# Patient Record
Sex: Male | Born: 1995 | Race: White | Hispanic: No | Marital: Single | Smoking: Former smoker
Health system: Southern US, Community
[De-identification: ages and names within clinical notes are randomized; demographics above are authoritative.]

## PROBLEM LIST (undated history)

## (undated) DIAGNOSIS — N200 Calculus of kidney: Secondary | ICD-10-CM

## (undated) HISTORY — PX: WRIST SURGERY: SHX841

---

## 2017-04-20 ENCOUNTER — Emergency Department: Payer: BLUE CROSS/BLUE SHIELD

## 2017-04-20 ENCOUNTER — Encounter: Payer: Self-pay | Admitting: Emergency Medicine

## 2017-04-20 ENCOUNTER — Emergency Department
Admission: EM | Admit: 2017-04-20 | Discharge: 2017-04-20 | Disposition: A | Discharge status: Home / Self Care | Payer: BLUE CROSS/BLUE SHIELD | Attending: Emergency Medicine | Admitting: Emergency Medicine

## 2017-04-20 DIAGNOSIS — N200 Calculus of kidney: Secondary | ICD-10-CM | POA: Diagnosis not present

## 2017-04-20 DIAGNOSIS — R109 Unspecified abdominal pain: Secondary | ICD-10-CM | POA: Diagnosis present

## 2017-04-20 DIAGNOSIS — Z87891 Personal history of nicotine dependence: Secondary | ICD-10-CM | POA: Insufficient documentation

## 2017-04-20 LAB — URINALYSIS, COMPLETE (UACMP) WITH MICROSCOPIC
BACTERIA UA: NONE SEEN
Bilirubin Urine: NEGATIVE
Glucose, UA: NEGATIVE mg/dL
Ketones, ur: NEGATIVE mg/dL
Leukocytes, UA: NEGATIVE
Nitrite: NEGATIVE
PROTEIN: NEGATIVE mg/dL
SPECIFIC GRAVITY, URINE: 1.02 (ref 1.005–1.030)
pH: 6 (ref 5.0–8.0)

## 2017-04-20 LAB — CBC
HEMATOCRIT: 46.6 % (ref 40.0–52.0)
Hemoglobin: 16.2 g/dL (ref 13.0–18.0)
MCH: 30.7 pg (ref 26.0–34.0)
MCHC: 34.8 g/dL (ref 32.0–36.0)
MCV: 88.1 fL (ref 80.0–100.0)
Platelets: 211 10*3/uL (ref 150–440)
RBC: 5.29 MIL/uL (ref 4.40–5.90)
RDW: 13.3 % (ref 11.5–14.5)
WBC: 10.8 10*3/uL — AB (ref 3.8–10.6)

## 2017-04-20 LAB — BASIC METABOLIC PANEL
ANION GAP: 9 (ref 5–15)
BUN: 16 mg/dL (ref 6–20)
CALCIUM: 9.8 mg/dL (ref 8.9–10.3)
CO2: 25 mmol/L (ref 22–32)
Chloride: 106 mmol/L (ref 101–111)
Creatinine, Ser: 1.11 mg/dL (ref 0.61–1.24)
GLUCOSE: 102 mg/dL — AB (ref 65–99)
POTASSIUM: 4.1 mmol/L (ref 3.5–5.1)
Sodium: 140 mmol/L (ref 135–145)

## 2017-04-20 MED ORDER — HYDROCODONE-ACETAMINOPHEN 5-325 MG PO TABS: 1.0000 | ORAL_TABLET | Freq: Four times a day (QID) | ORAL | 0 refills | Status: DC | PRN

## 2017-04-20 MED ORDER — PHENAZOPYRIDINE HCL 200 MG PO TABS: 200.0000 mg | ORAL_TABLET | Freq: Three times a day (TID) | ORAL | 0 refills | Status: AC | PRN

## 2017-04-20 MED ORDER — KETOROLAC TROMETHAMINE 30 MG/ML IJ SOLN
15.0000 mg | Freq: Once | INTRAMUSCULAR | Status: AC
  Administered 2017-04-20: 15 mg via INTRAVENOUS
  Filled 2017-04-20: qty 1

## 2017-04-20 MED ORDER — PHENAZOPYRIDINE HCL 200 MG PO TABS
200.0000 mg | ORAL_TABLET | Freq: Once | ORAL | Status: AC
  Administered 2017-04-20: 200 mg via ORAL
  Filled 2017-04-20: qty 1

## 2017-04-20 MED ORDER — SODIUM CHLORIDE 0.9 % IV BOLUS (SEPSIS)
1000.0000 mL | Freq: Once | INTRAVENOUS | Status: AC
  Administered 2017-04-20: 1000 mL via INTRAVENOUS

## 2017-04-20 MED ORDER — KETOROLAC TROMETHAMINE 30 MG/ML IJ SOLN
15.0000 mg | Freq: Once | INTRAMUSCULAR | Status: AC
  Administered 2017-04-20: 15 mg via INTRAVENOUS

## 2017-04-20 MED ORDER — KETOROLAC TROMETHAMINE 30 MG/ML IJ SOLN
INTRAMUSCULAR | Status: AC
  Filled 2017-04-20: qty 1

## 2017-04-20 MED ORDER — MORPHINE SULFATE (PF) 4 MG/ML IV SOLN
4.0000 mg | Freq: Once | INTRAVENOUS | Status: AC
  Administered 2017-04-20: 4 mg via INTRAVENOUS
  Filled 2017-04-20: qty 1

## 2017-04-20 MED ORDER — IOPAMIDOL (ISOVUE-300) INJECTION 61%
100.0000 mL | Freq: Once | INTRAVENOUS | Status: AC | PRN
  Administered 2017-04-20: 100 mL via INTRAVENOUS

## 2017-04-20 NOTE — ED Notes (Signed)
Report to Amy, RN

## 2017-04-20 NOTE — ED Notes (Signed)
Resumed care from ally rn.  Pt alert.  Iv fluids infusing.  Pt watching tv.  No family with pt.  Pt has nausea.  Pt reports low back pain.

## 2017-04-20 NOTE — ED Provider Notes (Signed)
[facility] Emergency Department Provider Note  ____________________________________________   First MD Initiated Contact with Patient 04/20/17 1321     (approximate)  I have reviewed the triage vital signs and the nursing notes.   HISTORY  Chief Complaint Flank Pain    HPI Timothy Schneider is a 21 y.o. male who self presents to the emergency department with 2 weeks of bilateral flank pain. He also reports hematuria several days ago as well as 2 days ago he had a temperature to 101 at home. Today he presented to an urgent care who advised him to come to the emergency department to evaluate for kidney stone versus kidney infection. He denies fevers or chills.His pain is severe bilateral flank nonradiating nothing seems to make it better or worse.   History reviewed. No pertinent past medical history.  There are no active problems to display for this patient.   Past Surgical History:  Procedure Laterality Date  . WRIST SURGERY      Prior to Admission medications   Medication Sig Start Date End Date Taking? Authorizing Provider  HYDROcodone-acetaminophen (NORCO) 5-325 MG tablet Take 1 tablet by mouth every 6 (six) hours as needed for severe pain. 04/20/17   Merrily Brittleifenbark, Victorio Creeden, MD  Ibuprofen 200 MG CAPS Take by mouth.    [provider]  oxyCODONE-acetaminophen (PERCOCET/ROXICET) 5-325 MG tablet Take 1 tablet by mouth every 4 (four) hours as needed.    [provider]  phenazopyridine (PYRIDIUM) 200 MG tablet Take 1 tablet (200 mg total) by mouth 3 (three) times daily as needed for pain. 04/20/17 04/23/17  Merrily Brittleifenbark, Chauncy Mangiaracina, MD    Allergies Morphine and related  History reviewed. No pertinent family history.  Social History Social History  Substance Use Topics  . Smoking status: Former Games developermoker  . Smokeless tobacco: Never Used  . Alcohol use Yes    Review of Systems Constitutional: No fever/chills Eyes: No visual changes. ENT: No  sore throat. Cardiovascular: Denies chest pain. Respiratory: Denies shortness of breath. Gastrointestinal: positive abdominal pain.  Positive nausea, no vomiting.  No diarrhea.  No constipation. Genitourinary: Positive for hematuria. Musculoskeletal: Positive for back pain. Skin: Negative for rash. Neurological: Negative for headaches, focal weakness or numbness.   ____________________________________________   PHYSICAL EXAM:  VITAL SIGNS: ED Triage Vitals  Enc Vitals Group     BP 04/20/17 1229 (!) 124/93     Pulse Rate 04/20/17 1229 81     Resp 04/20/17 1229 20     Temp 04/20/17 1229 98.4 F (36.9 C)     Temp Source 04/20/17 1229 Oral     SpO2 04/20/17 1229 100 %     Weight 04/20/17 1228 245 lb (111.1 kg)     Height 04/20/17 1228 6\' 1"  (1.854 m)     Head Circumference --      Peak Flow --      Pain Score 04/20/17 1228 9     Pain Loc --      Pain Edu? --      Excl. in GC? --     Constitutional: Alert and oriented x 4 Appears uncomfortable pacing around the room Eyes: PERRL EOMI. Head: Atraumatic. Nose: No congestion/rhinnorhea. Mouth/Throat: No trismus Neck: No stridor.   Cardiovascular: Normal rate, regular rhythm. Grossly normal heart sounds.  Good peripheral circulation. Respiratory: Normal respiratory effort.  No retractions. Lungs CTAB and moving good air Gastrointestinal: Soft nondistended diffusely tender to palpation without focality no McBurney's tenderness negative Rovsing's no costovertebral tenderness  Musculoskeletal: No lower extremity edema   Neurologic:  Normal speech and language. No gross focal neurologic deficits are appreciated. Skin:  Skin is warm, dry and intact. No rash noted. Psychiatric: Mood and affect are normal. Speech and behavior are normal.    ____________________________________________   DIFFERENTIAL  Kidney stone, pyelonephritis, appendicitis, diverticulitis ____________________________________________   LABS (all labs  ordered are listed, but only abnormal results are displayed)  Labs Reviewed  URINALYSIS, COMPLETE (UACMP) WITH MICROSCOPIC - Abnormal; Notable for the following:       Result Value   Color, Urine YELLOW (*)    APPearance CLEAR (*)    Hgb urine dipstick MODERATE (*)    Squamous Epithelial / LPF 0-5 (*)    All other components within normal limits  CBC - Abnormal; Notable for the following:    WBC 10.8 (*)    All other components within normal limits  BASIC METABOLIC PANEL - Abnormal; Notable for the following:    Glucose, Bld 102 (*)    All other components within normal limits  URINE CULTURE    Hematuria without evidence of urinary tract infection __________________________________________  EKG   ____________________________________________  RADIOLOGY  CT scan shows 2 small stones within the right kidney but no other clear etiology of the patient's symptoms ____________________________________________   PROCEDURES  Procedure(s) performed: no  Procedures  Critical Care performed: no  Observation: no ____________________________________________   INITIAL IMPRESSION / ASSESSMENT AND PLAN / ED COURSE  Pertinent labs & imaging results that were available during my care of the patient were reviewed by me and considered in my medical decision making (see chart for details).  The patient's story is concerning for kidney stone although temperature and some abdominal discomfort is concerning for possible pyelonephritis as well. I will do his CT instead with IV contrast.  CT scan of abdomen and pelvis does show 2 small right-sided stones within the kidney but no clear explanation for the patient's symptoms. He either has passed a stone which explains the persistent hematuria or since of facetious disorder and he has placed blood himself in the urinalysis. I performed [place] multi drug [place] and the patient has no opioid prescriptions. He also was nearly  incapacitated by a beer 4 mg of morphine so I doubt facetious disorder. The patient likely has passed a kidney stone. I will treat him symptomatically and refer him back to primary care.       ____________________________________________   FINAL CLINICAL IMPRESSION(S) / ED DIAGNOSES  Final diagnoses:  Kidney stone      NEW MEDICATIONS STARTED DURING THIS VISIT:  New Prescriptions   HYDROCODONE-ACETAMINOPHEN (NORCO) 5-325 MG TABLET    Take 1 tablet by mouth every 6 (six) hours as needed for severe pain.   PHENAZOPYRIDINE (PYRIDIUM) 200 MG TABLET    Take 1 tablet (200 mg total) by mouth 3 (three) times daily as needed for pain.     Note:  This document was prepared using Dragon voice recognition software and may include unintentional dictation errors.     Merrily Brittle, MD 04/20/17 1536

## 2017-04-20 NOTE — ED Notes (Signed)
Back pain for 2 weeks, worsening today. Pt able to urinate. Pt alert and oriented X4, active, cooperative, pt in NAD. RR even and unlabored, color WNL.

## 2017-04-20 NOTE — ED Triage Notes (Addendum)
Pt c/o bilateral flank pain for 2 weeks. Sent from urgent care for r/o kidney stones.  Did see blood in urine 2 days ago. Nausea no vomiting.  Fever 101 2 days ago per pt.  Ambulatory to triage.

## 2017-04-20 NOTE — Discharge Instructions (Signed)
Fortunately today her CT scan shows that the stone that you had before has already passed. It is normal to feel discomfort for several days afterwards. Please take over-the-counter pain medication as needed and follow up with the primary care physician within the next week or so for recheck. Return to the emergency department for any new or worsening symptoms such as if your pain worsens, if you cannot eat or drink, or for any other concerns whatsoever.  It was a pleasure to take care of you today, and thank you for coming to our emergency department.  If you have any questions or concerns before leaving please ask the nurse to grab me and I'm more than happy to go through your aftercare instructions again.  If you were prescribed any opioid pain medication today such as Norco, Vicodin, Percocet, morphine, hydrocodone, or oxycodone please make sure you do not drive when you are taking this medication as it can alter your ability to drive safely.  If you have any concerns once you are home that you are not improving or are in fact getting worse before you can make it to your follow-up appointment, please do not hesitate to call 911 and come back for further evaluation.  Merrily Brittle MD  Results for orders placed or performed during the hospital encounter of 04/20/17  Urinalysis, Complete w Microscopic  Result Value Ref Range   Color, Urine YELLOW (A) YELLOW   APPearance CLEAR (A) CLEAR   Specific Gravity, Urine 1.020 1.005 - 1.030   pH 6.0 5.0 - 8.0   Glucose, UA NEGATIVE NEGATIVE mg/dL   Hgb urine dipstick MODERATE (A) NEGATIVE   Bilirubin Urine NEGATIVE NEGATIVE   Ketones, ur NEGATIVE NEGATIVE mg/dL   Protein, ur NEGATIVE NEGATIVE mg/dL   Nitrite NEGATIVE NEGATIVE   Leukocytes, UA NEGATIVE NEGATIVE   RBC / HPF TOO NUMEROUS TO COUNT 0 - 5 RBC/hpf   WBC, UA 0-5 0 - 5 WBC/hpf   Bacteria, UA NONE SEEN NONE SEEN   Squamous Epithelial / LPF 0-5 (A) NONE SEEN   Mucous PRESENT   CBC  Result  Value Ref Range   WBC 10.8 (H) 3.8 - 10.6 K/uL   RBC 5.29 4.40 - 5.90 MIL/uL   Hemoglobin 16.2 13.0 - 18.0 g/dL   HCT 16.1 09.6 - 04.5 %   MCV 88.1 80.0 - 100.0 fL   MCH 30.7 26.0 - 34.0 pg   MCHC 34.8 32.0 - 36.0 g/dL   RDW 40.9 81.1 - 91.4 %   Platelets 211 150 - 440 K/uL  Basic metabolic panel  Result Value Ref Range   Sodium 140 135 - 145 mmol/L   Potassium 4.1 3.5 - 5.1 mmol/L   Chloride 106 101 - 111 mmol/L   CO2 25 22 - 32 mmol/L   Glucose, Bld 102 (H) 65 - 99 mg/dL   BUN 16 6 - 20 mg/dL   Creatinine, Ser 7.82 0.61 - 1.24 mg/dL   Calcium 9.8 8.9 - 95.6 mg/dL   GFR calc non Af Amer >60 >60 mL/min   GFR calc Af Amer >60 >60 mL/min   Anion gap 9 5 - 15   Ct Abdomen Pelvis W Contrast  Result Date: 04/20/2017 CLINICAL DATA:  Bilateral flank pain for 2 weeks.  No known injury. EXAM: CT ABDOMEN AND PELVIS WITH CONTRAST TECHNIQUE: Multidetector CT imaging of the abdomen and pelvis was performed using the standard protocol following bolus administration of intravenous contrast. CONTRAST:  100 ml ISOVUE-300 IOPAMIDOL (  ISOVUE-300) INJECTION 61% COMPARISON:  None. FINDINGS: Lower chest: The lung bases are clear. No pleural or pericardial effusion. Hepatobiliary: Marked and diffuse low attenuation throughout is consistent with fatty infiltration. No focal lesion. Gallbladder and biliary tree appear normal. Pancreas: Unremarkable. No pancreatic ductal dilatation or surrounding inflammatory changes. Spleen: Normal in size without focal abnormality. Adrenals/Urinary Tract: 2 nonobstructing right renal stones are identified in the lower pole. The larger measures 0.3 cm. The kidneys otherwise appear normal. No ureteral stone or hydronephrosis. Urinary bladder is unremarkable. Adrenal glands appear normal. Stomach/Bowel: Stomach is within normal limits. Appendix appears normal. No evidence of bowel wall thickening, distention, or inflammatory changes. Vascular/Lymphatic: No significant vascular findings  are present. No enlarged abdominal or pelvic lymph nodes. Reproductive: Prostate is unremarkable. Other:  No ascites. Musculoskeletal: Negative. IMPRESSION: No acute abnormality abdomen or pelvis. No finding to explain the patient's symptoms. Diffuse fatty infiltration of the liver. Two nonobstructing right renal stones.  The larger measures 0.3 cm. Electronically Signed   By: Drusilla Kannerhomas  Dalessio M.D.   On: 04/20/2017 14:06

## 2017-04-21 LAB — URINE CULTURE

## 2017-06-05 ENCOUNTER — Encounter: Payer: Self-pay | Admitting: Emergency Medicine

## 2017-06-05 ENCOUNTER — Emergency Department: Payer: BLUE CROSS/BLUE SHIELD

## 2017-06-05 DIAGNOSIS — Z87891 Personal history of nicotine dependence: Secondary | ICD-10-CM | POA: Insufficient documentation

## 2017-06-05 DIAGNOSIS — R079 Chest pain, unspecified: Secondary | ICD-10-CM | POA: Diagnosis present

## 2017-06-05 DIAGNOSIS — F419 Anxiety disorder, unspecified: Secondary | ICD-10-CM | POA: Diagnosis not present

## 2017-06-05 LAB — BASIC METABOLIC PANEL
Anion gap: 7 (ref 5–15)
BUN: 21 mg/dL — ABNORMAL HIGH (ref 6–20)
CO2: 27 mmol/L (ref 22–32)
Calcium: 9.2 mg/dL (ref 8.9–10.3)
Chloride: 106 mmol/L (ref 101–111)
Creatinine, Ser: 1.29 mg/dL — ABNORMAL HIGH (ref 0.61–1.24)
GFR calc Af Amer: >60 mL/min (ref >60)
GFR calc non Af Amer: >60 mL/min (ref >60)
Glucose, Bld: 82 mg/dL (ref 65–99)
Potassium: 4 mmol/L (ref 3.5–5.1)
Sodium: 140 mmol/L (ref 135–145)

## 2017-06-05 LAB — CBC
HCT: 44.8 % (ref 40.0–52.0)
HEMOGLOBIN: 15.7 g/dL (ref 13.0–18.0)
MCH: 31.3 pg (ref 26.0–34.0)
MCHC: 35.1 g/dL (ref 32.0–36.0)
MCV: 89.2 fL (ref 80.0–100.0)
Platelets: 216 10*3/uL (ref 150–440)
RBC: 5.02 MIL/uL (ref 4.40–5.90)
RDW: 13.3 % (ref 11.5–14.5)
WBC: 12.3 10*3/uL — ABNORMAL HIGH (ref 3.8–10.6)

## 2017-06-05 LAB — TROPONIN I

## 2017-06-05 NOTE — ED Triage Notes (Signed)
Pt ambulatory to triage with steady gait, no distress noted. Pt c/o intermittent left sided chest pain that radiates into left arm, symptoms per pt have been going on for 2 months and today the pain and pressure has been constant with nausea. Pt sts "I think it may be anxiety because I have a long family history of it."

## 2017-06-06 ENCOUNTER — Emergency Department
Admission: EM | Admit: 2017-06-06 | Discharge: 2017-06-06 | Disposition: A | Discharge status: Home / Self Care | Payer: BLUE CROSS/BLUE SHIELD | Attending: Emergency Medicine | Admitting: Emergency Medicine

## 2017-06-06 DIAGNOSIS — F419 Anxiety disorder, unspecified: Secondary | ICD-10-CM

## 2017-06-06 LAB — FIBRIN DERIVATIVES D-DIMER (ARMC ONLY): Fibrin derivatives D-dimer (ARMC): 131.09 (ref 0.00–499.00)

## 2017-06-06 MED ORDER — LORAZEPAM 1 MG PO TABS: 1.0000 mg | ORAL_TABLET | Freq: Three times a day (TID) | ORAL | 0 refills | Status: AC | PRN

## 2017-06-06 MED ORDER — LORAZEPAM 2 MG/ML IJ SOLN
1.0000 mg | Freq: Once | INTRAMUSCULAR | Status: AC
  Administered 2017-06-06: 0.5 mg via INTRAVENOUS
  Filled 2017-06-06: qty 1

## 2017-06-06 NOTE — ED Notes (Signed)
Pt reports to ED w/ c/o CP. Pt sts that CP has been ongoing x 2 months. Pt sts that during episodes of CP x 2 months that he has SOB, nausea, dizziness and lightheadedness. Pt sts that this week tightness radiates to L arm, that L arm has felt numb. Pt also sts that he "passed out" this week r/t CP. Pt also c/o tremors that began 24 minutes prior to being roomed. VSS, pt able to speak w/o issue

## 2017-06-06 NOTE — ED Provider Notes (Signed)
[facility] Emergency Department Provider Note _   First MD Initiated Contact with Patient 06/06/17 0032     (approximate)  I have reviewed the triage vital signs and the nursing notes.   HISTORY  Chief Complaint Chest Pain    HPI Timothy Schneider is a 21 y.o. male presents to the emergency Department with multiple medical complaints including reproducible left sided chest pain with radiation to left arm, left arm numbness, positive dyspnea, positive headaches, positive nausea. Patient states that he wants to be checked for "cancer". Patient states that symptoms of been occurring for the past 2 months patient states that he does believe that he has anxiety and that he has a long family history of the same.   Past medical history None There are no active problems to display for this patient.   Past Surgical History:  Procedure Laterality Date  . WRIST SURGERY      Prior to Admission medications   Medication Sig Start Date End Date Taking? Authorizing Provider  HYDROcodone-acetaminophen (NORCO) 5-325 MG tablet Take 1 tablet by mouth every 6 (six) hours as needed for severe pain. 04/20/17   Merrily Brittle, MD  Ibuprofen 200 MG CAPS Take by mouth.    [provider]  oxyCODONE-acetaminophen (PERCOCET/ROXICET) 5-325 MG tablet Take 1 tablet by mouth every 4 (four) hours as needed.    [provider]    Allergies Morphine and related  History reviewed. No pertinent family history.  Social History Social History  Substance Use Topics  . Smoking status: Former Games developer  . Smokeless tobacco: Never Used  . Alcohol use Yes    Review of Systems Constitutional: Positive fever/chills Eyes: Positive visual changes. ENT: No sore throat. Cardiovascular: Positive chest pain. Respiratory: Positive shortness of breath. Gastrointestinal: Positive abdominal pain.  Positive nausea, no vomiting. Positive diarrhea.  No  constipation. Genitourinary: Positive for dysuria. Musculoskeletal: Positive for neck pain.  Positive for back pain. Integumentary: Negative for rash. Neurological: Positive for headaches, focal  numbness.   ____________________________________________   PHYSICAL EXAM:  VITAL SIGNS: ED Triage Vitals [06/05/17 2236]  Enc Vitals Group     BP 137/69     Pulse Rate 67     Resp 18     Temp 98.7 F (37.1 C)     Temp Source Oral     SpO2 99 %     Weight 107 kg (236 lb)     Height 1.854 m (6\' 1" )     Head Circumference      Peak Flow      Pain Score      Pain Loc      Pain Edu?      Excl. in GC?     Constitutional: Alert and oriented. Well appearing and in no acute distress.Anxious affect Eyes: Conjunctivae are normal. Head: Atraumatic. Mouth/Throat: Mucous membranes are moist.  Oropharynx non-erythematous. Neck: No stridor.  No meningeal signs. Cardiovascular: Normal rate, regular rhythm. Good peripheral circulation. Grossly normal heart sounds. Respiratory: Normal respiratory effort.  No retractions. Lungs CTAB. Gastrointestinal: Soft and nontender. No distention.  Musculoskeletal: No lower extremity tenderness nor edema. No gross deformities of extremities. Neurologic:  Normal speech and language. No gross focal neurologic deficits are appreciated.  Skin:  Skin is warm, dry and intact. No rash noted. Psychiatric: Mood and affect are normal. Speech and behavior are normal.  ____________________________________________   LABS (all labs ordered are listed, but only abnormal results are displayed)  Labs Reviewed  BASIC METABOLIC PANEL - Abnormal; Notable for the following:       Result Value   BUN 21 (*)    Creatinine, Ser 1.29 (*)    All other components within normal limits  CBC - Abnormal; Notable for the following:    WBC 12.3 (*)    All other components within normal limits  TROPONIN I  FIBRIN DERIVATIVES D-DIMER ([facility] ONLY)    ____________________________________________  EKG  ED ECG REPORT I, Drain N Yariana Hoaglund, the attending physician, personally viewed and interpreted this ECG.   Date: 06/06/2017  EKG Time: 10:32 PM  Rate: 67  Rhythm: Normal sinus rhythm  Axis: Normal  Intervals: Normal  ST&T Change: None  ____________________________________________  RADIOLOGY I, [place] Eiko Mcgowen, personally viewed and evaluated these images (plain radiographs) as part of my medical decision making, as well as reviewing the written report by the radiologist.  Dg Chest 2 View  Result Date: 06/05/2017 CLINICAL DATA:  Intermittent left-sided chest pain x2 months. EXAM: CHEST  2 VIEW COMPARISON:  04/20/2017 CT abdomen for the lung bases FINDINGS: The heart size and mediastinal contours are within normal limits. Both lungs are clear. The visualized skeletal structures are unremarkable. IMPRESSION: No active cardiopulmonary disease. Electronically Signed   By: Tollie Ethavid  Kwon M.D.   On: 06/05/2017 23:25      Procedures   ____________________________________________   INITIAL IMPRESSION / ASSESSMENT AND PLAN / ED COURSE  Pertinent labs & imaging results that were available during my care of the patient were reviewed by me and considered in my medical decision making (see chart for details).   Patient history and physical exam consistent with anxiety. Patient given Ativan 0.5mg  and will be referred to Dr Maryruth BunKapur     ____________________________________________  FINAL CLINICAL IMPRESSION(S) / ED DIAGNOSES  Final diagnoses:  Anxiety     MEDICATIONS GIVEN DURING THIS VISIT:  Medications  LORazepam (ATIVAN) injection 1 mg (0.5 mg Intravenous Given 06/06/17 0142)     NEW OUTPATIENT MEDICATIONS STARTED DURING THIS VISIT:  New Prescriptions   No medications on file    Modified Medications   No medications on file    Discontinued Medications   No medications on file     Note:  This document was  prepared using Dragon voice recognition software and may include unintentional dictation errors.    Darci CurrentBrown,  N, MD 06/06/17 0230

## 2017-11-16 ENCOUNTER — Encounter: Payer: Self-pay | Admitting: Emergency Medicine

## 2017-11-16 ENCOUNTER — Other Ambulatory Visit: Payer: Self-pay

## 2017-11-16 ENCOUNTER — Emergency Department: Payer: BLUE CROSS/BLUE SHIELD

## 2017-11-16 ENCOUNTER — Emergency Department
Admission: EM | Admit: 2017-11-16 | Discharge: 2017-11-16 | Disposition: A | Discharge status: Home / Self Care | Payer: BLUE CROSS/BLUE SHIELD | Attending: Emergency Medicine | Admitting: Emergency Medicine

## 2017-11-16 DIAGNOSIS — R109 Unspecified abdominal pain: Secondary | ICD-10-CM | POA: Diagnosis present

## 2017-11-16 DIAGNOSIS — Z87891 Personal history of nicotine dependence: Secondary | ICD-10-CM | POA: Diagnosis not present

## 2017-11-16 DIAGNOSIS — N200 Calculus of kidney: Secondary | ICD-10-CM | POA: Diagnosis not present

## 2017-11-16 DIAGNOSIS — Z79899 Other long term (current) drug therapy: Secondary | ICD-10-CM | POA: Diagnosis not present

## 2017-11-16 HISTORY — DX: Calculus of kidney: N20.0

## 2017-11-16 LAB — CBC WITH DIFFERENTIAL/PLATELET
BASOS PCT: 1 %
Basophils Absolute: 0.1 10*3/uL (ref 0–0.1)
EOS ABS: 0.1 10*3/uL (ref 0–0.7)
EOS PCT: 1 %
HCT: 47.8 % (ref 40.0–52.0)
HEMOGLOBIN: 16.4 g/dL (ref 13.0–18.0)
LYMPHS ABS: 2.5 10*3/uL (ref 1.0–3.6)
Lymphocytes Relative: 26 %
MCH: 30.2 pg (ref 26.0–34.0)
MCHC: 34.3 g/dL (ref 32.0–36.0)
MCV: 88.1 fL (ref 80.0–100.0)
Monocytes Absolute: 0.6 10*3/uL (ref 0.2–1.0)
Monocytes Relative: 6 %
NEUTROS PCT: 66 %
Neutro Abs: 6.6 10*3/uL — ABNORMAL HIGH (ref 1.4–6.5)
PLATELETS: 217 10*3/uL (ref 150–440)
RBC: 5.43 MIL/uL (ref 4.40–5.90)
RDW: 13.4 % (ref 11.5–14.5)
WBC: 9.9 10*3/uL (ref 3.8–10.6)

## 2017-11-16 LAB — COMPREHENSIVE METABOLIC PANEL
ALBUMIN: 4.3 g/dL (ref 3.5–5.0)
ALT: 47 U/L (ref 17–63)
ANION GAP: 8 (ref 5–15)
AST: 35 U/L (ref 15–41)
Alkaline Phosphatase: 72 U/L (ref 38–126)
BUN: 14 mg/dL (ref 6–20)
CHLORIDE: 103 mmol/L (ref 101–111)
CO2: 26 mmol/L (ref 22–32)
Calcium: 9.3 mg/dL (ref 8.9–10.3)
Creatinine, Ser: 0.88 mg/dL (ref 0.61–1.24)
GFR calc non Af Amer: >60 mL/min (ref >60)
GLUCOSE: 99 mg/dL (ref 65–99)
Potassium: 4 mmol/L (ref 3.5–5.1)
SODIUM: 137 mmol/L (ref 135–145)
Total Bilirubin: 0.9 mg/dL (ref 0.3–1.2)
Total Protein: 7.6 g/dL (ref 6.5–8.1)

## 2017-11-16 LAB — LIPASE, BLOOD: Lipase: 22 U/L (ref 11–51)

## 2017-11-16 MED ORDER — SODIUM CHLORIDE 0.9 % IV BOLUS (SEPSIS)
1000.0000 mL | Freq: Once | INTRAVENOUS | Status: AC
  Administered 2017-11-16: 1000 mL via INTRAVENOUS

## 2017-11-16 MED ORDER — OXYCODONE-ACETAMINOPHEN 5-325 MG PO TABS: 1.0000 | ORAL_TABLET | ORAL | 0 refills | Status: DC | PRN

## 2017-11-16 MED ORDER — OXYCODONE-ACETAMINOPHEN 5-325 MG PO TABS
1.0000 | ORAL_TABLET | Freq: Once | ORAL | Status: AC
  Administered 2017-11-16: 1 via ORAL
  Filled 2017-11-16: qty 1

## 2017-11-16 MED ORDER — ONDANSETRON 4 MG PO TBDP: 4.0000 mg | ORAL_TABLET | Freq: Three times a day (TID) | ORAL | 0 refills | Status: DC | PRN

## 2017-11-16 MED ORDER — KETOROLAC TROMETHAMINE 30 MG/ML IJ SOLN
30.0000 mg | Freq: Once | INTRAMUSCULAR | Status: AC
  Administered 2017-11-16: 30 mg via INTRAVENOUS
  Filled 2017-11-16: qty 1

## 2017-11-16 MED ORDER — TAMSULOSIN HCL 0.4 MG PO CAPS: 0.4000 mg | ORAL_CAPSULE | Freq: Every day | ORAL | 0 refills | Status: DC

## 2017-11-16 NOTE — ED Notes (Signed)
Pt states that he thinks he has a kidney stone. Reports lower back pain, nausea, and unable to urinate. Started 2 weeks ago.

## 2017-11-16 NOTE — ED Triage Notes (Signed)
Patient ambulatory to triage with steady gait, without difficulty or distress noted; pt reports "I want to be checked for kidney stones"; c/o difficulty urinating, nausea, and lower back/abd pain

## 2017-11-18 NOTE — ED Provider Notes (Signed)
[facility] Emergency Department Provider Note   First MD Initiated Contact with Patient 11/16/17 [phone]     (approximate)  I have reviewed the triage vital signs and the nursing notes.   HISTORY  Chief Complaint Back Pain and Abdominal Pain   HPI Timothy RossettiDustin Schneider is a 21 y.o. male with previous history of kidney stone presents to the emergency department with bilateral flank pain right worse than left with current pain score of 9 out of 10.  Patient states symptoms began yesterday.  Patient admits to difficulty urinating but however denies any dysuria.  Patient admits to nausea.  She denies any fever afebrile on presentation temperature 98.7.  Patient denies any aggravating or alleviating factors.   Past Medical History:  Diagnosis Date  . Kidney stone     There are no active problems to display for this patient.   Past Surgical History:  Procedure Laterality Date  . WRIST SURGERY      Prior to Admission medications   Medication Sig Start Date End Date Taking? Authorizing Provider  escitalopram (LEXAPRO) 10 MG tablet Take 10 mg by mouth daily.   Yes [provider]  Ibuprofen 200 MG CAPS Take by mouth.   Yes [provider]  Vitamin D, Ergocalciferol, (DRISDOL) 50000 units CAPS capsule Take 1 capsule by mouth once a week.   Yes [provider]  HYDROcodone-acetaminophen (NORCO) 5-325 MG tablet Take 1 tablet by mouth every 6 (six) hours as needed for severe pain. Patient not taking: Reported on 11/16/2017 04/20/17   Merrily Brittleifenbark, Neil, MD  LORazepam (ATIVAN) 1 MG tablet Take 1 tablet (1 mg total) by mouth every 8 (eight) hours as needed for anxiety. Patient not taking: Reported on 11/16/2017 06/06/17   Darci CurrentBrown, [place] N, MD  ondansetron (ZOFRAN ODT) 4 MG disintegrating tablet Take 1 tablet (4 mg total) by mouth every 8 (eight) hours as needed for nausea or vomiting. 11/16/17   Darci CurrentBrown, [place] N, MD  oxyCODONE-acetaminophen  (ROXICET) 5-325 MG tablet Take 1 tablet by mouth every 4 (four) hours as needed for severe pain. 11/16/17   Darci CurrentBrown, [place] N, MD  tamsulosin [facility](FLOMAX) 0.4 MG CAPS capsule Take 1 capsule (0.4 mg total) by mouth daily after breakfast. 11/16/17   Darci CurrentBrown,  N, MD    Allergies Morphine and related  No family history on file.  Social History Social History   Tobacco Use  . Smoking status: Former Games developermoker  . Smokeless tobacco: Never Used  Substance Use Topics  . Alcohol use: Yes  . Drug use: No    Review of Systems Constitutional: No fever/chills Eyes: No visual changes. ENT: No sore throat. Cardiovascular: Denies chest pain. Respiratory: Denies shortness of breath. Gastrointestinal: No abdominal pain.  Positive for nausea, no vomiting.  No diarrhea.  No constipation. Genitourinary: Negative for dysuria. Musculoskeletal: Negative for neck pain.  Positive  for back pain. Integumentary: Negative for rash. Neurological: Negative for headaches, focal weakness or numbness.  ____________________________________________   PHYSICAL EXAM:  VITAL SIGNS: ED Triage Vitals  Enc Vitals Group     BP 11/16/17 0512 140/80     Pulse Rate 11/16/17 0512 77     Resp 11/16/17 0512 18     Temp 11/16/17 0512 98.7 F (37.1 C)     Temp Source 11/16/17 0512 Oral     SpO2 11/16/17 0512 100 %     Weight 11/16/17 0452 104.3 kg (230 lb)     Height 11/16/17 0452 1.829 m (6')  Head Circumference --      Peak Flow --      Pain Score 11/16/17 0452 10     Pain Loc --      Pain Edu? --      Excl. in GC? --     Constitutional: Alert and oriented. Well appearing and in no acute distress. Eyes: Conjunctivae are normal.  Head: Atraumatic. Mouth/Throat: Mucous membranes are moist.  Oropharynx non-erythematous. Neck: No stridor.   Cardiovascular: Normal rate, regular rhythm. Good peripheral circulation. Grossly normal heart sounds. Respiratory: Normal respiratory effort.  No retractions. Lungs  CTAB. Gastrointestinal: Soft and nontender. No distention.  Musculoskeletal: No lower extremity tenderness nor edema. No gross deformities of extremities. Neurologic:  Normal speech and language. No gross focal neurologic deficits are appreciated.  Skin:  Skin is warm, dry and intact. No rash noted. Psychiatric: Mood and affect are normal. Speech and behavior are normal.  ____________________________________________   LABS (all labs ordered are listed, but only abnormal results are displayed)  Labs Reviewed  CBC WITH DIFFERENTIAL/PLATELET - Abnormal; Notable for the following components:      Result Value   Neutro Abs 6.6 (*)    All other components within normal limits  COMPREHENSIVE METABOLIC PANEL  LIPASE, BLOOD     RADIOLOGY I, Stafford N Charde Macfarlane, personally viewed and evaluated these images (plain radiographs) as part of my medical decision making, as well as reviewing the written report by the radiologist.  CLINICAL DATA:  Acute onset of lower back and abdominal pain. Nausea. Difficulty urinating.  EXAM: CT ABDOMEN AND PELVIS WITHOUT CONTRAST  TECHNIQUE: Multidetector CT imaging of the abdomen and pelvis was performed following the standard protocol without IV contrast.  COMPARISON:  CT of the abdomen and pelvis from 04/20/2017  FINDINGS: Lower chest: The visualized lung bases are grossly clear. The visualized portions of the mediastinum are unremarkable. The  Hepatobiliary: The liver is unremarkable in appearance. The gallbladder is unremarkable in appearance. The common bile duct remains normal in caliber.  Pancreas: The pancreas is within normal limits.  Spleen: The spleen is unremarkable in appearance.  Adrenals/Urinary Tract: The adrenal glands are unremarkable in appearance.  Minimal right-sided hydronephrosis is noted, with an obstructing 4 x 3 mm stone proximally at the right ureteropelvic junction. No nonobstructing renal stones are  identified. No perinephric stranding is seen.  Stomach/Bowel: The stomach is unremarkable in appearance. The small bowel is within normal limits. The appendix is normal in caliber, without evidence of appendicitis. The colon is unremarkable in appearance.  Vascular/Lymphatic: The abdominal aorta is unremarkable in appearance. The inferior vena cava is grossly unremarkable. No retroperitoneal lymphadenopathy is seen. No pelvic sidewall lymphadenopathy is identified.  Reproductive: The bladder is mildly distended and grossly unremarkable. The prostate remains normal in size.  Other: No additional soft tissue abnormalities are seen.  Musculoskeletal: No acute osseous abnormalities are identified. The visualized musculature is unremarkable in appearance.  IMPRESSION: Minimal right-sided hydronephrosis, with an obstructing 4 x 3 mm stone proximally at the right ureteropelvic junction.   Electronically Signed   By: Roanna RaiderJeffery  Chang M.D.   On: 11/16/2017 05:58    Procedures   ____________________________________________   INITIAL IMPRESSION / ASSESSMENT AND PLAN / ED COURSE  As part of my medical decision making, I reviewed the following data within the electronic MEDICAL RECORD [id number] year old male presented with history and physical exam consistent with possible ureterolithiasis which was confirmed with CT renal protocol scan.  Patient given IV Toradol with improvement of  pain followed by Percocet.  Patient will be prescribed Percocet, Zofran and Flomax for home with recommendation to follow-up with urology. ____________________________________________  FINAL CLINICAL IMPRESSION(S) / ED DIAGNOSES  Final diagnoses:  Kidney stone     MEDICATIONS GIVEN DURING THIS VISIT:  Medications  ketorolac (TORADOL) 30 MG/ML injection 30 mg (30 mg Intravenous Given 11/16/17 0524)  sodium chloride 0.9 % bolus 1,000 mL (0 mLs Intravenous Stopped 11/16/17 0655)    oxyCODONE-acetaminophen (PERCOCET/ROXICET) 5-325 MG per tablet 1 tablet (1 tablet Oral Given 11/16/17 0655)     ED Discharge Orders        Ordered    oxyCODONE-acetaminophen (ROXICET) 5-325 MG tablet  Every 4 hours PRN     11/16/17 0649    ondansetron (ZOFRAN ODT) 4 MG disintegrating tablet  Every 8 hours PRN     11/16/17 0649    tamsulosin (FLOMAX) 0.4 MG CAPS capsule  Daily after breakfast     11/16/17 6045       Note:  This document was prepared using Dragon voice recognition software and may include unintentional dictation errors.    Darci Current, MD 11/18/17 [phone]

## 2018-04-30 ENCOUNTER — Emergency Department (HOSPITAL_COMMUNITY): Payer: Self-pay

## 2018-04-30 ENCOUNTER — Emergency Department (HOSPITAL_COMMUNITY)
Admission: EM | Admit: 2018-04-30 | Discharge: 2018-05-01 | Disposition: A | Discharge status: Home / Self Care | Payer: Self-pay | Attending: Emergency Medicine | Admitting: Emergency Medicine

## 2018-04-30 ENCOUNTER — Encounter (HOSPITAL_COMMUNITY): Payer: Self-pay

## 2018-04-30 DIAGNOSIS — R079 Chest pain, unspecified: Secondary | ICD-10-CM | POA: Insufficient documentation

## 2018-04-30 DIAGNOSIS — Z5321 Procedure and treatment not carried out due to patient leaving prior to being seen by health care provider: Secondary | ICD-10-CM | POA: Insufficient documentation

## 2018-04-30 LAB — BASIC METABOLIC PANEL
ANION GAP: 8 (ref 5–15)
BUN: 10 mg/dL (ref 6–20)
CALCIUM: 9.3 mg/dL (ref 8.9–10.3)
CHLORIDE: 102 mmol/L (ref 101–111)
CO2: 27 mmol/L (ref 22–32)
Creatinine, Ser: 1.01 mg/dL (ref 0.61–1.24)
GFR calc non Af Amer: >60 mL/min (ref >60)
Glucose, Bld: 90 mg/dL (ref 65–99)
Potassium: 4 mmol/L (ref 3.5–5.1)
Sodium: 137 mmol/L (ref 135–145)

## 2018-04-30 LAB — CBC
HCT: 48 % (ref 39.0–52.0)
HEMOGLOBIN: 16.5 g/dL (ref 13.0–17.0)
MCH: 30 pg (ref 26.0–34.0)
MCHC: 34.4 g/dL (ref 30.0–36.0)
MCV: 87.3 fL (ref 78.0–100.0)
Platelets: 227 10*3/uL (ref 150–400)
RBC: 5.5 MIL/uL (ref 4.22–5.81)
RDW: 12.8 % (ref 11.5–15.5)
WBC: 9.1 10*3/uL (ref 4.0–10.5)

## 2018-04-30 LAB — I-STAT TROPONIN, ED: TROPONIN I, POC: 0 ng/mL (ref 0.00–0.08)

## 2018-04-30 NOTE — ED Triage Notes (Signed)
Pt presents with 3 day h/o L sided chest pain.  Pt reports nausea and shortness of breath;  Reports pain has been constant and radiates to L scapula.  Pt reports burning sensation in both hands; reports x 1 month he passes out after eating certain foods.

## 2019-01-27 ENCOUNTER — Other Ambulatory Visit (HOSPITAL_BASED_OUTPATIENT_CLINIC_OR_DEPARTMENT_OTHER): Payer: Self-pay | Admitting: Physician Assistant

## 2019-01-27 DIAGNOSIS — R945 Abnormal results of liver function studies: Principal | ICD-10-CM

## 2019-01-27 DIAGNOSIS — R7989 Other specified abnormal findings of blood chemistry: Secondary | ICD-10-CM

## 2020-03-21 ENCOUNTER — Encounter (HOSPITAL_BASED_OUTPATIENT_CLINIC_OR_DEPARTMENT_OTHER): Payer: Self-pay

## 2020-03-21 ENCOUNTER — Emergency Department (HOSPITAL_BASED_OUTPATIENT_CLINIC_OR_DEPARTMENT_OTHER)
Admission: EM | Admit: 2020-03-21 | Discharge: 2020-03-21 | Disposition: A | Discharge status: Home / Self Care | Payer: Self-pay | Attending: Emergency Medicine | Admitting: Emergency Medicine

## 2020-03-21 ENCOUNTER — Emergency Department (HOSPITAL_BASED_OUTPATIENT_CLINIC_OR_DEPARTMENT_OTHER): Payer: Self-pay

## 2020-03-21 ENCOUNTER — Other Ambulatory Visit: Payer: Self-pay

## 2020-03-21 DIAGNOSIS — Z87891 Personal history of nicotine dependence: Secondary | ICD-10-CM | POA: Insufficient documentation

## 2020-03-21 DIAGNOSIS — R509 Fever, unspecified: Secondary | ICD-10-CM | POA: Insufficient documentation

## 2020-03-21 DIAGNOSIS — H9201 Otalgia, right ear: Secondary | ICD-10-CM | POA: Insufficient documentation

## 2020-03-21 DIAGNOSIS — Z79899 Other long term (current) drug therapy: Secondary | ICD-10-CM | POA: Insufficient documentation

## 2020-03-21 DIAGNOSIS — K0501 Acute gingivitis, non-plaque induced: Secondary | ICD-10-CM | POA: Insufficient documentation

## 2020-03-21 DIAGNOSIS — Z885 Allergy status to narcotic agent status: Secondary | ICD-10-CM | POA: Insufficient documentation

## 2020-03-21 DIAGNOSIS — K0889 Other specified disorders of teeth and supporting structures: Secondary | ICD-10-CM

## 2020-03-21 DIAGNOSIS — K0510 Chronic gingivitis, plaque induced: Secondary | ICD-10-CM

## 2020-03-21 LAB — CBC WITH DIFFERENTIAL/PLATELET
Abs Immature Granulocytes: 0.12 10*3/uL — ABNORMAL HIGH (ref 0.00–0.07)
Basophils Absolute: 0.1 10*3/uL (ref 0.0–0.1)
Basophils Relative: 1 %
Eosinophils Absolute: 0.2 10*3/uL (ref 0.0–0.5)
Eosinophils Relative: 1 %
HCT: 47.2 % (ref 39.0–52.0)
Hemoglobin: 16.6 g/dL (ref 13.0–17.0)
Immature Granulocytes: 1 %
Lymphocytes Relative: 39 %
Lymphs Abs: 4.8 10*3/uL — ABNORMAL HIGH (ref 0.7–4.0)
MCH: 31.2 pg (ref 26.0–34.0)
MCHC: 35.2 g/dL (ref 30.0–36.0)
MCV: 88.7 fL (ref 80.0–100.0)
Monocytes Absolute: 0.9 10*3/uL (ref 0.1–1.0)
Monocytes Relative: 7 %
Neutro Abs: 6.2 10*3/uL (ref 1.7–7.7)
Neutrophils Relative %: 51 %
Platelets: 230 10*3/uL (ref 150–400)
RBC: 5.32 MIL/uL (ref 4.22–5.81)
RDW: 12.9 % (ref 11.5–15.5)
WBC: 12.3 10*3/uL — ABNORMAL HIGH (ref 4.0–10.5)
nRBC: 0 % (ref 0.0–0.2)

## 2020-03-21 LAB — BASIC METABOLIC PANEL
Anion gap: 8 (ref 5–15)
BUN: 12 mg/dL (ref 6–20)
CO2: 27 mmol/L (ref 22–32)
Calcium: 9 mg/dL (ref 8.9–10.3)
Chloride: 104 mmol/L (ref 98–111)
Creatinine, Ser: 0.96 mg/dL (ref 0.61–1.24)
GFR calc Af Amer: >60 mL/min (ref >60)
GFR calc non Af Amer: >60 mL/min (ref >60)
Glucose, Bld: 129 mg/dL — ABNORMAL HIGH (ref 70–99)
Potassium: 3.7 mmol/L (ref 3.5–5.1)
Sodium: 139 mmol/L (ref 135–145)

## 2020-03-21 LAB — LACTIC ACID, PLASMA: Lactic Acid, Venous: 1.3 mmol/L (ref 0.5–1.9)

## 2020-03-21 MED ORDER — SODIUM CHLORIDE 0.9 % IV BOLUS
1000.0000 mL | Freq: Once | INTRAVENOUS | Status: AC
  Administered 2020-03-21: 1000 mL via INTRAVENOUS

## 2020-03-21 MED ORDER — CLINDAMYCIN HCL 300 MG PO CAPS: 300.0000 mg | ORAL_CAPSULE | Freq: Three times a day (TID) | ORAL | 0 refills | Status: AC

## 2020-03-21 MED ORDER — HYDROMORPHONE HCL 1 MG/ML IJ SOLN
0.5000 mg | Freq: Once | INTRAMUSCULAR | Status: AC
  Administered 2020-03-21: 0.5 mg via INTRAVENOUS
  Filled 2020-03-21: qty 1

## 2020-03-21 MED ORDER — ONDANSETRON HCL 4 MG/2ML IJ SOLN
4.0000 mg | Freq: Once | INTRAMUSCULAR | Status: AC
  Administered 2020-03-21: 4 mg via INTRAVENOUS
  Filled 2020-03-21: qty 2

## 2020-03-21 MED ORDER — IOHEXOL 300 MG/ML  SOLN
100.0000 mL | Freq: Once | INTRAMUSCULAR | Status: AC | PRN
  Administered 2020-03-21: 75 mL via INTRAVENOUS

## 2020-03-21 MED ORDER — CLINDAMYCIN PHOSPHATE 600 MG/50ML IV SOLN
600.0000 mg | Freq: Once | INTRAVENOUS | Status: AC
  Administered 2020-03-21: 600 mg via INTRAVENOUS
  Filled 2020-03-21: qty 50

## 2020-03-21 MED ORDER — SODIUM CHLORIDE 0.9 % IV SOLN
INTRAVENOUS | Status: DC | PRN
  Administered 2020-03-21: 250 mL via INTRAVENOUS

## 2020-03-21 NOTE — ED Triage Notes (Signed)
Pt arrives with c/o facial pain and swelling to right face X2 days. States he has a bad tooth on bottom right and upper right. Has used tylenol, garlic, and salt water rinses with no relief from pain. Reports fever at home of 102 took Tylenol PTA, temp 98.1.

## 2020-03-21 NOTE — Discharge Instructions (Addendum)
Take clindamycin as prescribed and complete the full course.  Rinse with Listerine after every meal.  Follow-up with a dentist as soon as possible. Return to ER for new or worsening symptoms.

## 2020-03-21 NOTE — Progress Notes (Addendum)
Patient called out complaining of SOB. RT went and assessed, patient was laying flat. I sat him up and helped him reposition. SAT 100%. Stated he was still in pain. RN aware.

## 2020-03-21 NOTE — ED Provider Notes (Signed)
[facility] EMERGENCY DEPARTMENT Provider Note   CSN: [id number] Arrival date & time: 03/21/20  1024     History Chief Complaint  Patient presents with  . Dental Pain    Timothy Schneider is a 24 y.o. male.  24 year old male presents with complaint of right upper and lower dental pain with swelling.  Patient states he is having teeth and been going bad for some time however developed significant pain 2 days ago, reports of bloody drainage from the areas as well as fever of 102 at home.  Patient took Tylenol just prior to arrival.  Also reports vomiting yesterday.  States he feels like there is something under his tongue as well as blurry vision in his right eye. Denies trauma to the area. No other complaints or concerns.         Past Medical History:  Diagnosis Date  . Kidney stone     There are no problems to display for this patient.   Past Surgical History:  Procedure Laterality Date  . WRIST SURGERY         No family history on file.  Social History   Tobacco Use  . Smoking status: Former Games developer  . Smokeless tobacco: Never Used  Substance Use Topics  . Alcohol use: Yes  . Drug use: No    Home Medications Prior to Admission medications   Medication Sig Start Date End Date Taking? Authorizing Provider  clindamycin (CLEOCIN) 300 MG capsule Take 1 capsule (300 mg total) by mouth 3 (three) times daily for 7 days. 03/21/20 03/28/20  Jeannie Fend, PA-C  escitalopram (LEXAPRO) 10 MG tablet Take 10 mg by mouth daily.    [provider]  HYDROcodone-acetaminophen (NORCO) 5-325 MG tablet Take 1 tablet by mouth every 6 (six) hours as needed for severe pain. Patient not taking: Reported on 11/16/2017 04/20/17   Merrily Brittle, MD  Ibuprofen 200 MG CAPS Take by mouth.    [provider]  LORazepam (ATIVAN) 1 MG tablet Take 1 tablet (1 mg total) by mouth every 8 (eight) hours as needed for anxiety. Patient not taking: Reported on 11/16/2017  06/06/17   Darci Current, MD  ondansetron (ZOFRAN ODT) 4 MG disintegrating tablet Take 1 tablet (4 mg total) by mouth every 8 (eight) hours as needed for nausea or vomiting. 11/16/17   Darci Current, MD  oxyCODONE-acetaminophen (ROXICET) 5-325 MG tablet Take 1 tablet by mouth every 4 (four) hours as needed for severe pain. 11/16/17   Darci Current, MD  tamsulosin [facility]) 0.4 MG CAPS capsule Take 1 capsule (0.4 mg total) by mouth daily after breakfast. 11/16/17   Darci Current, MD  Vitamin D, Ergocalciferol, (DRISDOL) 50000 units CAPS capsule Take 1 capsule by mouth once a week.    [provider]    Allergies    Morphine and related  Review of Systems   Review of Systems  Constitutional: Positive for fever. Negative for chills and diaphoresis.  HENT: Positive for dental problem, ear pain and facial swelling. Negative for sore throat.   Eyes: Positive for visual disturbance.  Respiratory: Negative for shortness of breath.   Cardiovascular: Negative for chest pain.  Gastrointestinal: Positive for nausea and vomiting. Negative for abdominal pain.  Skin: Negative for color change, rash and wound.  Neurological: Negative for headaches.  Hematological: Positive for adenopathy.  All other systems reviewed and are negative.   Physical Exam Updated Vital Signs BP (!) 141/110 (BP Location: Right Arm)  Pulse 89   Temp 98.1 F (36.7 C) (Oral)   Resp 18   Ht 6\' 1"  (1.854 m)   Wt 122 kg   SpO2 97%   BMI 35.49 kg/m   Physical Exam Vitals and nursing note reviewed.  Constitutional:      General: He is not in acute distress.    Appearance: He is obese. He is not ill-appearing, toxic-appearing or diaphoretic.  HENT:     Head: Normocephalic and atraumatic.     Jaw: No trismus.     Comments: Submandibular tenderness on right, gingival swelling, tenderness to teeth without obvious abscess    Right Ear: Ear canal and external ear normal. A middle ear effusion is  present. Tympanic membrane is injected. Tympanic membrane is not erythematous.     Nose: Nose normal.     Mouth/Throat:     Mouth: Mucous membranes are moist.     Dentition: Dental tenderness and gingival swelling present.     Pharynx: No oropharyngeal exudate or posterior oropharyngeal erythema.  Eyes:     Extraocular Movements: Extraocular movements intact.     Conjunctiva/sclera: Conjunctivae normal.     Pupils: Pupils are equal, round, and reactive to light.  Cardiovascular:     Rate and Rhythm: Normal rate and regular rhythm.     Pulses: Normal pulses.     Heart sounds: Normal heart sounds.  Pulmonary:     Effort: Pulmonary effort is normal.     Breath sounds: Normal breath sounds.  Musculoskeletal:     Cervical back: Tenderness present.  Lymphadenopathy:     Cervical: No cervical adenopathy.  Skin:    General: Skin is warm and dry.     Findings: No erythema or rash.  Neurological:     Mental Status: He is alert and oriented to person, place, and time.  Psychiatric:        Behavior: Behavior normal.     ED Results / Procedures / Treatments   Labs (all labs ordered are listed, but only abnormal results are displayed) Labs Reviewed  BASIC METABOLIC PANEL - Abnormal; Notable for the following components:      Result Value   Glucose, Bld 129 (*)    All other components within normal limits  CBC WITH DIFFERENTIAL/PLATELET - Abnormal; Notable for the following components:   WBC 12.3 (*)    Lymphs Abs 4.8 (*)    Abs Immature Granulocytes 0.12 (*)    All other components within normal limits  LACTIC ACID, PLASMA    EKG None  Radiology CT Soft Tissue Neck W Contrast  Result Date: 03/21/2020 CLINICAL DATA:  Right facial swelling tooth pain EXAM: CT NECK WITH CONTRAST TECHNIQUE: Multidetector CT imaging of the neck was performed using the standard protocol following the bolus administration of intravenous contrast. CONTRAST:  31mL OMNIPAQUE IOHEXOL 300 MG/ML  SOLN  COMPARISON:  None. FINDINGS: Pharynx and larynx: Mild prominence of the adenoids and palatine tonsils. No mass. Salivary glands: Unremarkable. Thyroid: Normal. Lymph nodes: There are no pathologically enlarged lymph nodes. Vascular: Major neck vessels are patent. Limited intracranial: No abnormal enhancement. Visualized orbits: Unremarkable. Mastoids and visualized paranasal sinuses: Aerated. Skeleton: Unremarkable Upper chest: Included upper lungs are clear. Other: None IMPRESSION: No evidence of significant inflammatory changes. Mild prominence of the adenoids and palatine tonsils, likely reactive. Electronically Signed   By: Macy Mis M.D.   On: 03/21/2020 12:08    Procedures Procedures (including critical care time)  Medications Ordered in ED Medications  0.9 %  sodium chloride infusion (250 mLs Intravenous New Bag/Given 03/21/20 1225)  sodium chloride 0.9 % bolus 1,000 mL ( Intravenous Stopped 03/21/20 1223)  clindamycin (CLEOCIN) IVPB 600 mg (600 mg Intravenous New Bag/Given 03/21/20 1227)  ondansetron (ZOFRAN) injection 4 mg (4 mg Intravenous Given 03/21/20 1220)  HYDROmorphone (DILAUDID) injection 0.5 mg (0.5 mg Intravenous Given 03/21/20 1220)  iohexol (OMNIPAQUE) 300 MG/ML solution 100 mL (75 mLs Intravenous Contrast Given 03/21/20 1147)    ED Course  I have reviewed the triage vital signs and the nursing notes.  Pertinent labs & imaging results that were available during my care of the patient were reviewed by me and considered in my medical decision making (see chart for details).  Clinical Course as of Mar 21 1324  Sun Mar 21, 2020  1321 23yo male with complaint of fever to 102 at home just prior to arrival, right upper and lower as well as frontal dental pain with report of bleeding from gums. Denies trauma. States he feels like theres a bone protruding into his submandibular space. On exam, no trismus, no obvious dental abscess, does have gingivitis, as well as submandibular  tenderness more so on the right side. Patient is afebrile here but did take tylenol prior to arrival.  Labs with mild leukocytosis of 12.3, BMP unremarkable. Lactic acid 1.3. CT maxillofacial ordered to evaluate for soft tissue/deep space infection, negative for significant findings to explain patient's symptoms.  Patient was given Clindamycin while in the ER. Will dc on same with referral to DDS. Recommend return to ER for new or worsening symptoms.    [LM]    Clinical Course User Index [LM] Alden Hipp   MDM Rules/Calculators/A&P                      Final Clinical Impression(s) / ED Diagnoses Final diagnoses:  Pain, dental  Gingivitis  Right ear pain    Rx / DC Orders ED Discharge Orders         Ordered    clindamycin (CLEOCIN) 300 MG capsule  3 times daily     03/21/20 1227           Jeannie Fend, PA-C 03/21/20 1325    Jacalyn Lefevre, MD 03/21/20 1528

## 2020-07-09 IMAGING — CT CT NECK W/ CM
5 series · 16 of 33 positions shown, 18 images · IV contrast (Omnipaque)
Comparison: None.

CLINICAL DATA: Right facial swelling tooth pain

EXAM:
CT NECK WITH CONTRAST
TECHNIQUE: Multidetector CT imaging of the neck was performed using the
standard protocol following the bolus administration of intravenous
contrast.
CONTRAST:  75mL OMNIPAQUE IOHEXOL 300 MG/ML  SOLN

[Series 3: axial neck · axial · 0.60mm/px · z∈[-208,-122]mm · 2 of 129 slices shown]
[im 43/129  bone]
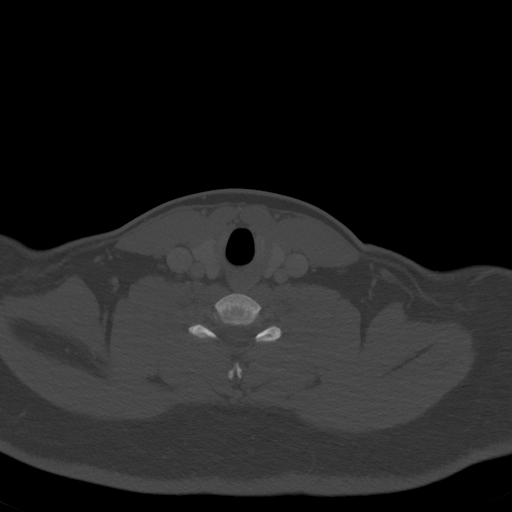
[im 86/129  bone]
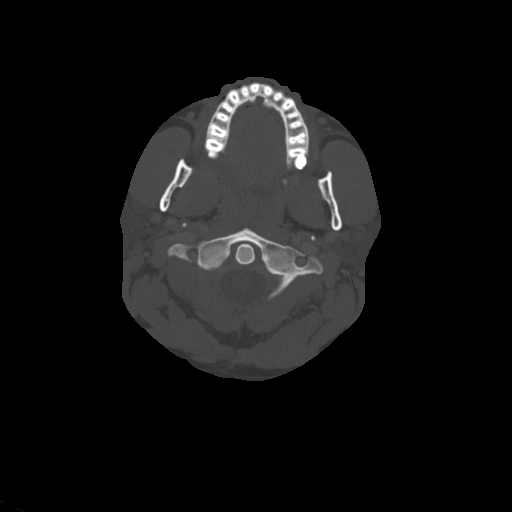

[Series 6: sag neck · sagittal · 0.62mm/px · 5 of 117 slices shown, 6 images]
[im 39/117  bone]
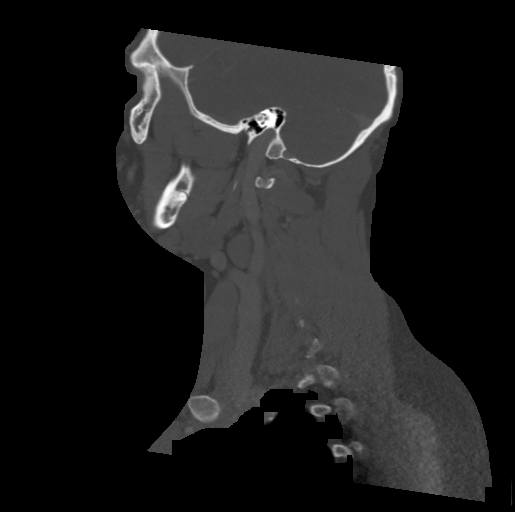
[im 49/117  bone]
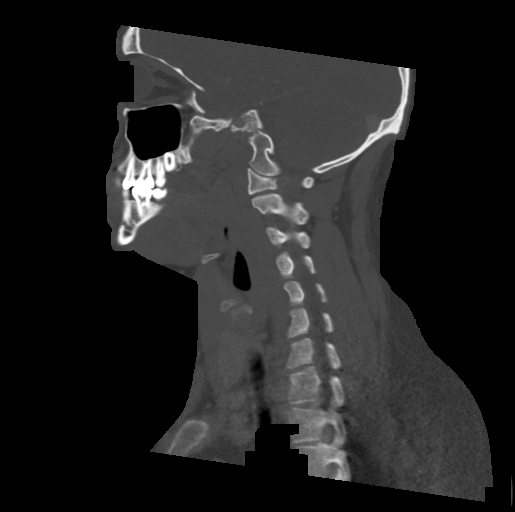
[im 59/117  soft-tissue]
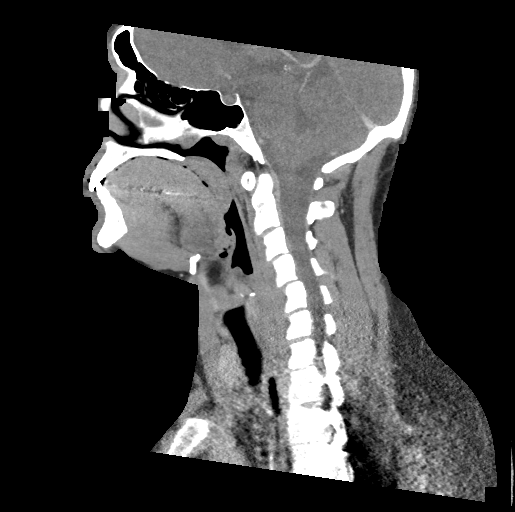
[im 59/117  bone]
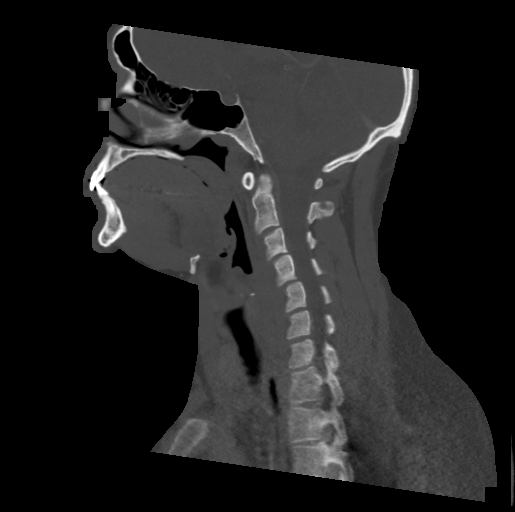
[im 68/117  bone]
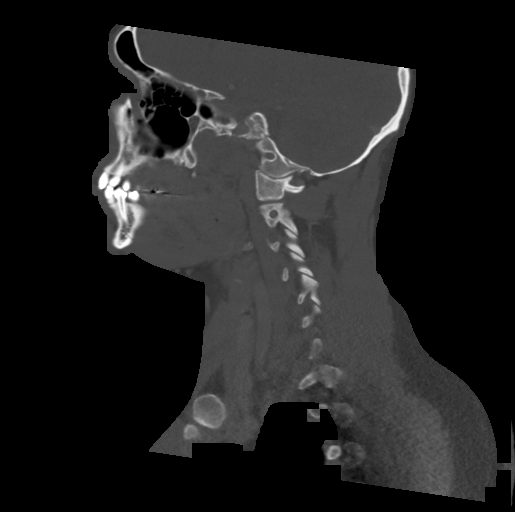
[im 78/117  bone]
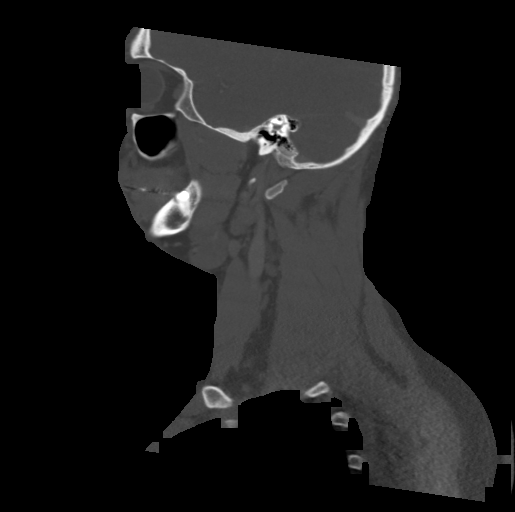

[Series 7: cor neck · coronal · 0.50mm/px · 3 of 109 slices shown]
[im 22/109  bone]
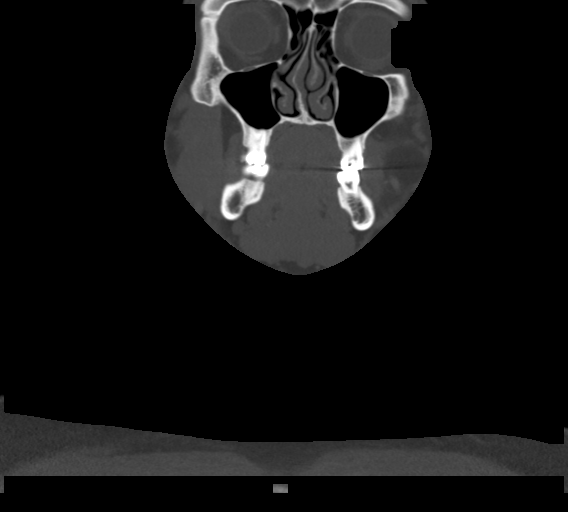
[im 44/109  bone]
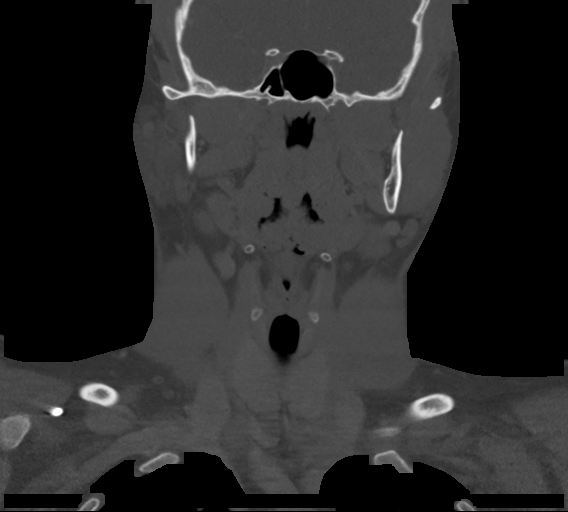
[im 65/109  bone]
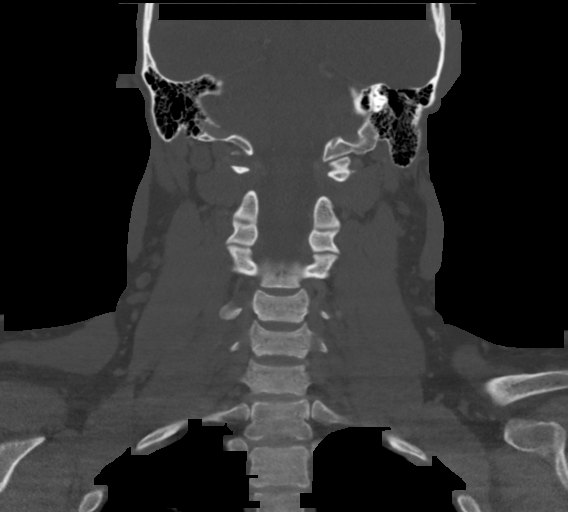

[Series 8: orthogonal ax · axial · 0.39mm/px · z∈[-265,-128]mm · 3 of 140 slices shown, 4 images]
[im 35/140  soft-tissue]
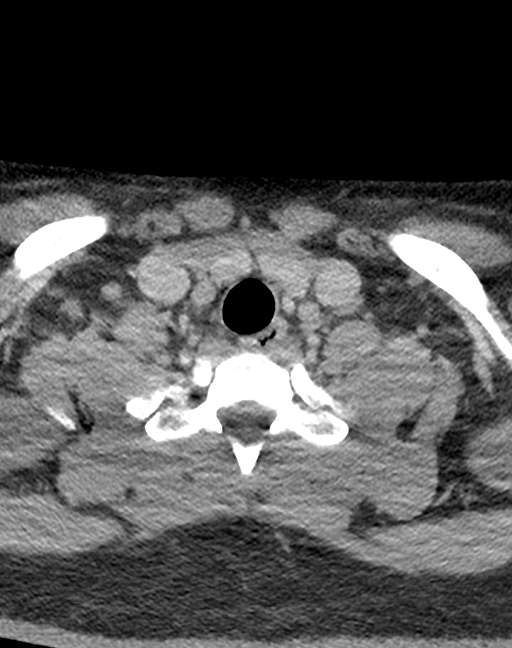
[im 35/140  bone]
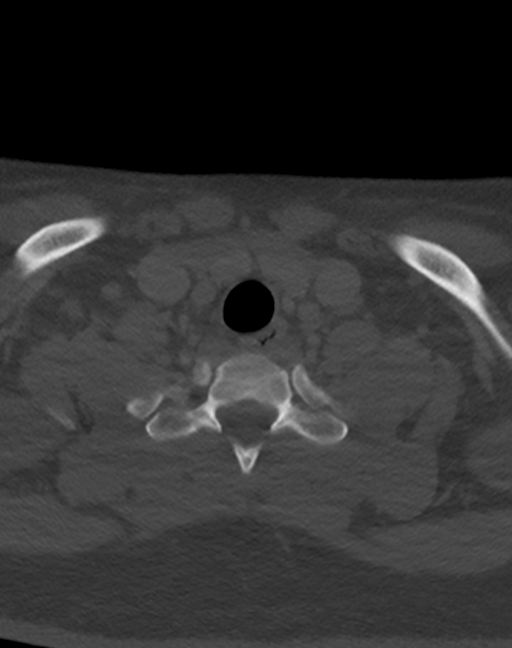
[im 70/140  bone]
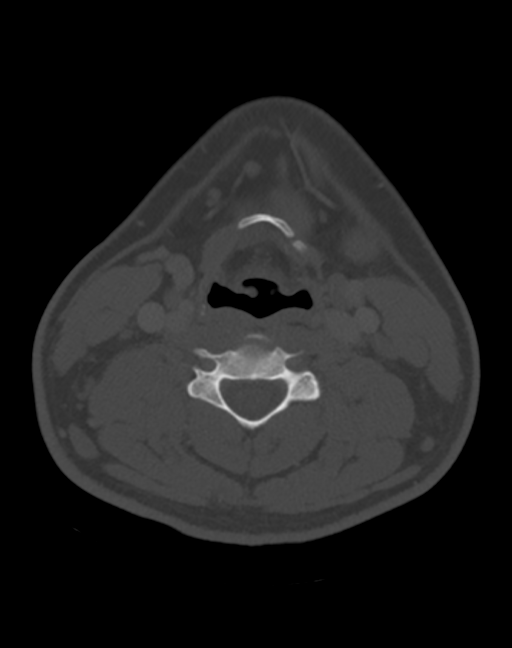
[im 105/140  bone]
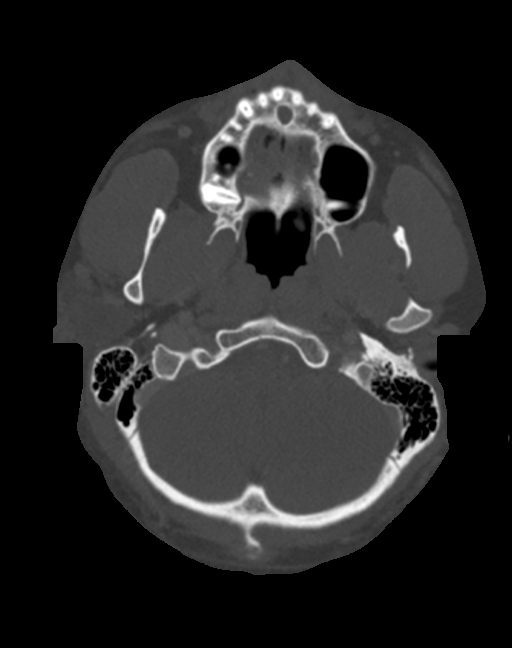

[Series 9: axial neck (person_name) · axial · 0.60mm/px · z∈[-228,-100]mm · 3 of 129 slices shown]
[im 33/129  bone]
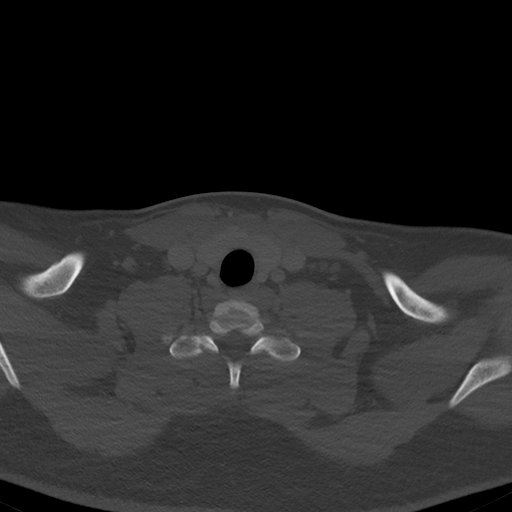
[im 65/129  bone]
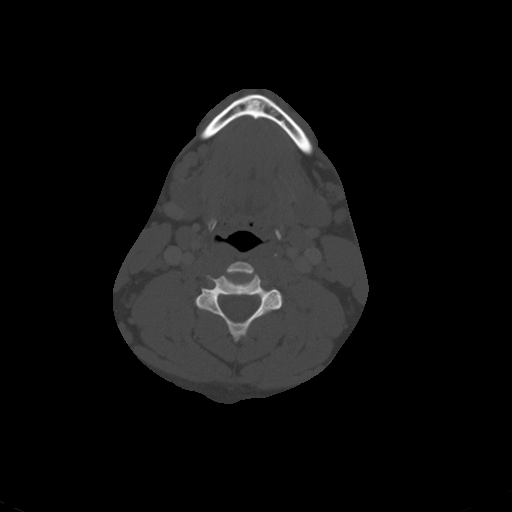
[im 97/129  bone]
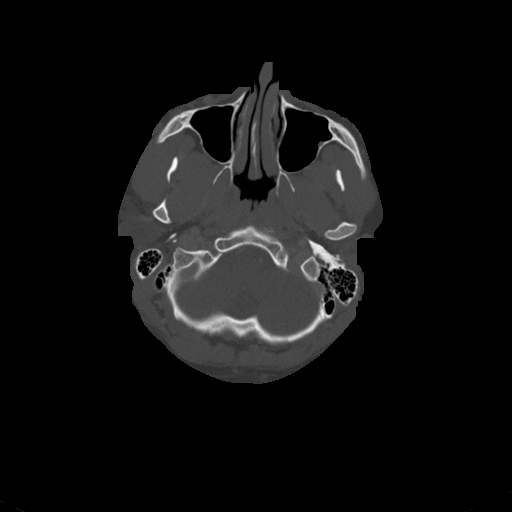

[16 of 33 positions shown; findings below may reference images not displayed]

FINDINGS: Pharynx and larynx: Mild prominence of the adenoids and palatine
tonsils. No mass.

Salivary glands: Unremarkable.

Thyroid: Normal.

Lymph nodes: There are no pathologically enlarged lymph nodes.

Vascular: Major neck vessels are patent.

Limited intracranial: No abnormal enhancement.

Visualized orbits: Unremarkable.

Mastoids and visualized paranasal sinuses: Aerated.

Skeleton: Unremarkable

Upper chest: Included upper lungs are clear.

Other: None
IMPRESSION: No evidence of significant inflammatory changes. Mild prominence of
the adenoids and palatine tonsils, likely reactive.

## 2021-11-22 ENCOUNTER — Encounter (HOSPITAL_BASED_OUTPATIENT_CLINIC_OR_DEPARTMENT_OTHER): Payer: Self-pay

## 2021-11-22 ENCOUNTER — Emergency Department (HOSPITAL_BASED_OUTPATIENT_CLINIC_OR_DEPARTMENT_OTHER): Payer: Self-pay

## 2021-11-22 ENCOUNTER — Emergency Department (HOSPITAL_BASED_OUTPATIENT_CLINIC_OR_DEPARTMENT_OTHER)
Admission: EM | Admit: 2021-11-22 | Discharge: 2021-11-22 | Disposition: A | Discharge status: Home / Self Care | Payer: Self-pay | Attending: Emergency Medicine | Admitting: Emergency Medicine

## 2021-11-22 ENCOUNTER — Other Ambulatory Visit: Payer: Self-pay

## 2021-11-22 DIAGNOSIS — N200 Calculus of kidney: Secondary | ICD-10-CM | POA: Insufficient documentation

## 2021-11-22 DIAGNOSIS — N139 Obstructive and reflux uropathy, unspecified: Secondary | ICD-10-CM | POA: Insufficient documentation

## 2021-11-22 LAB — URINALYSIS, MICROSCOPIC (REFLEX)

## 2021-11-22 LAB — URINALYSIS, ROUTINE W REFLEX MICROSCOPIC
Bilirubin Urine: NEGATIVE
Glucose, UA: NEGATIVE mg/dL
Ketones, ur: NEGATIVE mg/dL
Nitrite: NEGATIVE
Protein, ur: NEGATIVE mg/dL
Specific Gravity, Urine: 1.025 (ref 1.005–1.030)
pH: 6.5 (ref 5.0–8.0)

## 2021-11-22 MED ORDER — OXYCODONE HCL 5 MG PO TABS
5.0000 mg | ORAL_TABLET | Freq: Once | ORAL | Status: AC
  Administered 2021-11-22: 5 mg via ORAL
  Filled 2021-11-22: qty 1

## 2021-11-22 MED ORDER — OXYCODONE HCL 5 MG PO TABS: 5.0000 mg | ORAL_TABLET | Freq: Four times a day (QID) | ORAL | 0 refills | Status: AC | PRN

## 2021-11-22 MED ORDER — ONDANSETRON HCL 4 MG PO TABS: 4.0000 mg | ORAL_TABLET | Freq: Four times a day (QID) | ORAL | 0 refills | Status: AC

## 2021-11-22 MED ORDER — TAMSULOSIN HCL 0.4 MG PO CAPS: 0.4000 mg | ORAL_CAPSULE | Freq: Every day | ORAL | 0 refills | Status: AC

## 2021-11-22 NOTE — ED Notes (Signed)
D/c paperwork reviewed with pt, including prescriptions.  Pt provided with urine strainer per his request, no further questions or concerns at time of d/c. Pt ambulatory to ED exit with steady gait, NAD.

## 2021-11-22 NOTE — Discharge Instructions (Addendum)
Recommend 1000 mg of Tylenol every 6 hours as needed for pain.  Take Roxicodone for breakthrough pain but this is a narcotic pain medicine so do not use with heavy machinery use, driving, other drugs or alcohol.  Follow-up with urology.  Please return if you develop uncontrollable pain, fever greater than 100.4.

## 2021-11-22 NOTE — ED Provider Notes (Signed)
[facility] DEPARTMENT Provider Note   CSN: [id number] Arrival date & time: 11/22/21  1728     History  Chief Complaint  Patient presents with   Flank Pain    Timothy Schneider is a 26 y.o. male.  Right-sided flank pain on and off for the last several weeks.  History of kidney stones.  Denies any fevers or chills.  The history is provided by the patient.  Flank Pain This is a new problem. The current episode started more than 1 week ago. The problem occurs every several days. Pertinent negatives include no chest pain, no abdominal pain, no headaches and no shortness of breath. Nothing aggravates the symptoms. Nothing relieves the symptoms. He has tried nothing for the symptoms. The treatment provided no relief.      Home Medications Prior to Admission medications   Medication Sig Start Date End Date Taking? Authorizing Provider  ondansetron (ZOFRAN) 4 MG tablet Take 1 tablet (4 mg total) by mouth every 6 (six) hours. 11/22/21  Yes Tyshaun Vinzant, DO  oxyCODONE (ROXICODONE) 5 MG immediate release tablet Take 1 tablet (5 mg total) by mouth every 6 (six) hours as needed for up to 15 doses for breakthrough pain. 11/22/21  Yes Wagner Tanzi, DO  escitalopram (LEXAPRO) 10 MG tablet Take 10 mg by mouth daily.    [provider]  Ibuprofen 200 MG CAPS Take by mouth.    [provider]  LORazepam (ATIVAN) 1 MG tablet Take 1 tablet (1 mg total) by mouth every 8 (eight) hours as needed for anxiety. Patient not taking: Reported on 11/16/2017 06/06/17   Gregor Hams, MD  tamsulosin (FLOMAX) 0.4 MG CAPS capsule Take 1 capsule (0.4 mg total) by mouth daily after breakfast for 7 days. 11/22/21 11/29/21  Ameriah Lint, DO  Vitamin D, Ergocalciferol, (DRISDOL) 50000 units CAPS capsule Take 1 capsule by mouth once a week.    [provider]      Allergies    Morphine and related    Review of Systems   Review of Systems  Constitutional:  Negative for  chills and fever.  HENT:  Negative for ear pain and sore throat.   Eyes:  Negative for pain and visual disturbance.  Respiratory:  Negative for cough and shortness of breath.   Cardiovascular:  Negative for chest pain and palpitations.  Gastrointestinal:  Negative for abdominal pain and vomiting.  Genitourinary:  Positive for flank pain. Negative for dysuria and hematuria.  Musculoskeletal:  Negative for arthralgias and back pain.  Skin:  Negative for color change and rash.  Neurological:  Negative for seizures, syncope and headaches.  All other systems reviewed and are negative.  Physical Exam Updated Vital Signs BP 134/83 (BP Location: Left Arm)    Pulse 74    Temp 98.4 F (36.9 C) (Oral)    Resp 16    Ht 6\' 2"  (1.88 m)    Wt 124.7 kg    SpO2 100%    BMI 35.31 kg/m  Physical Exam Vitals and nursing note reviewed.  Constitutional:      General: He is not in acute distress.    Appearance: He is well-developed.  HENT:     Head: Normocephalic and atraumatic.     Mouth/Throat:     Mouth: Mucous membranes are moist.  Eyes:     Extraocular Movements: Extraocular movements intact.     Conjunctiva/sclera: Conjunctivae normal.     Pupils: Pupils are equal, round, and reactive to  light.  Cardiovascular:     Rate and Rhythm: Normal rate and regular rhythm.     Heart sounds: No murmur heard. Pulmonary:     Effort: Pulmonary effort is normal. No respiratory distress.     Breath sounds: Normal breath sounds.  Abdominal:     Palpations: Abdomen is soft.     Tenderness: There is no abdominal tenderness. There is right CVA tenderness.  Musculoskeletal:        General: No swelling.     Cervical back: Neck supple.  Skin:    General: Skin is warm and dry.     Capillary Refill: Capillary refill takes less than 2 seconds.  Neurological:     Mental Status: He is alert.  Psychiatric:        Mood and Affect: Mood normal.    ED Results / Procedures / Treatments   Labs (all labs ordered  are listed, but only abnormal results are displayed) Labs Reviewed  URINALYSIS, ROUTINE W REFLEX MICROSCOPIC - Abnormal; Notable for the following components:      Result Value   Hgb urine dipstick LARGE (*)    Leukocytes,Ua TRACE (*)    All other components within normal limits  URINALYSIS, MICROSCOPIC (REFLEX) - Abnormal; Notable for the following components:   Bacteria, UA FEW (*)    All other components within normal limits    EKG None  Radiology CT Renal Stone Study  Result Date: 11/22/2021 CLINICAL DATA:  Flank pain. EXAM: CT ABDOMEN AND PELVIS WITHOUT CONTRAST TECHNIQUE: Multidetector CT imaging of the abdomen and pelvis was performed following the standard protocol without IV contrast. COMPARISON:  CT abdomen and pelvis 04/20/2017. FINDINGS: Lower chest: No acute abnormality. Hepatobiliary: No focal liver abnormality is seen. No gallstones, gallbladder wall thickening, or biliary dilatation. Pancreas: Unremarkable. No pancreatic ductal dilatation or surrounding inflammatory changes. Spleen: Normal in size without focal abnormality. Adrenals/Urinary Tract: There is a 3 mm calculus in the mid right ureter. There is mild right-sided hydronephrosis. No additional urinary tract calculi are seen. The left kidney, adrenal glands and bladder are within normal limits. Stomach/Bowel: Stomach is within normal limits. Appendix appears normal. No evidence of bowel wall thickening, distention, or inflammatory changes. Vascular/Lymphatic: No significant vascular findings are present. No enlarged abdominal or pelvic lymph nodes. There are nonenlarged central mesenteric lymph nodes. Reproductive: Prostate is unremarkable. Other: There is trace free fluid in the pelvis. No focal abdominal wall hernia. Musculoskeletal: No acute or significant osseous findings. IMPRESSION: 1. 3 mm calculus in the mid right ureter with mild obstructive uropathy. 2. Trace amount of free fluid in the pelvis. Electronically Signed    By: Ronney Asters M.D.   On: 11/22/2021 18:16    Procedures Procedures    Medications Ordered in ED Medications  oxyCODONE (Oxy IR/ROXICODONE) immediate release tablet 5 mg (has no administration in time range)    ED Course/ Medical Decision Making/ A&P                           Medical Decision Making  Timothy Schneider is a 26 year old male with history of kidney stones who presents the ED with right flank pain.  Differential includes appendicitis versus kidney stone versus pyelonephritis/UTI.  Lab work and imaging have been ordered and interpreted by myself.  Urinalysis shows blood but no obvious infection.  Vital signs are normal.  No fever.  CT scan obtained and evaluated and interpreted by me shows 3 mm mid  right ureter kidney stone.  Otherwise imaging is unremarkable.  Overall he is fairly asymptomatic on my examination.  Patient to be treated with pain medicine, Flomax, Zofran.  We will have him follow-up with urologist.  He understands return precautions including fever, uncontrollable pain, nausea, vomiting.  Discharged in good condition.  This chart was dictated using voice recognition software.  Despite best efforts to proofread,  errors can occur which can change the documentation meaning.         Final Clinical Impression(s) / ED Diagnoses Final diagnoses:  Kidney stone    Rx / DC Orders ED Discharge Orders          Ordered    tamsulosin (FLOMAX) 0.4 MG CAPS capsule  Daily after breakfast        11/22/21 2057    oxyCODONE (ROXICODONE) 5 MG immediate release tablet  Every 6 hours PRN        11/22/21 2057    ondansetron (ZOFRAN) 4 MG tablet  Every 6 hours        11/22/21 2057              Lennice Sites, DO 11/22/21 2057

## 2021-11-22 NOTE — ED Triage Notes (Signed)
Pt c/o right flank pain since 12/196-states feel like a kidney stone-states he has not sought medical treamtent-NAD-steady gait

## 2022-03-12 IMAGING — CT CT RENAL STONE PROTOCOL
2 of 4 series · 16 of 46 positions shown, 18 images · non-contrast
Comparison: CT abdomen and pelvis 04/20/2017.

CLINICAL DATA: Flank pain.

EXAM:
CT ABDOMEN AND PELVIS WITHOUT CONTRAST
TECHNIQUE: Multidetector CT imaging of the abdomen and pelvis was performed
following the standard protocol without IV contrast.

[Series 2: axial st · axial · 0.98mm/px · z∈[-558,-48]mm · 13 of 112 slices shown, 15 images]
[im 5/112  soft-tissue]
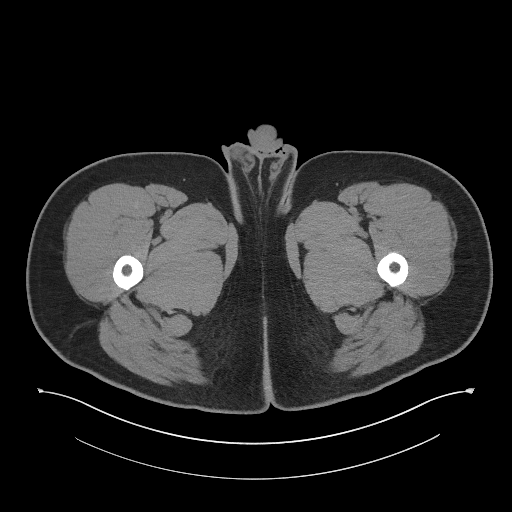
[im 5/112  bone]
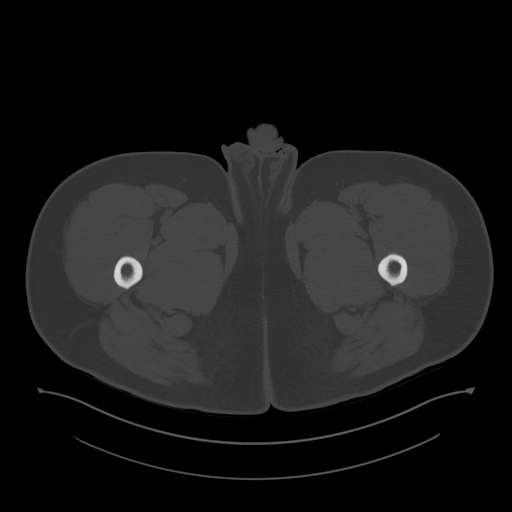
[im 14/112  soft-tissue]
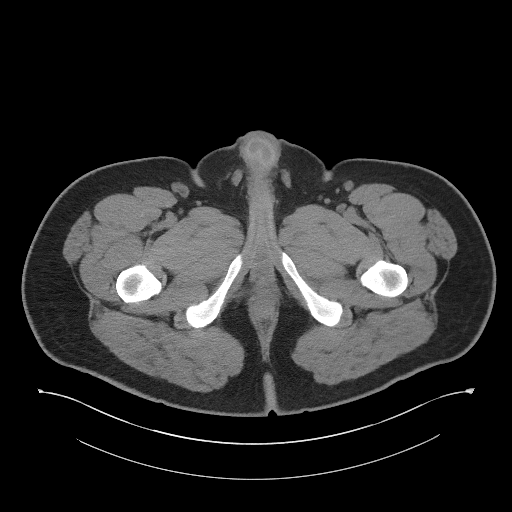
[im 24/112  soft-tissue]
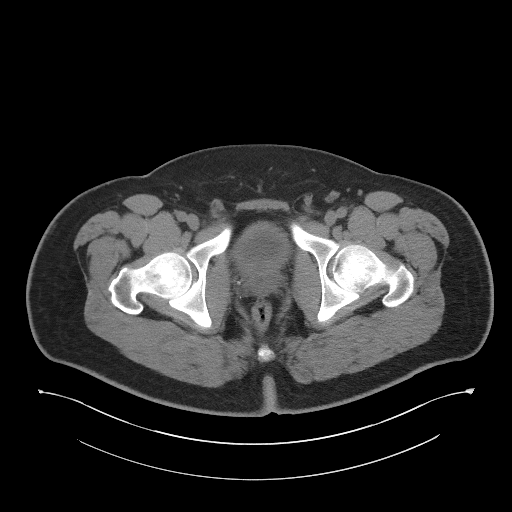
[im 33/112  soft-tissue]
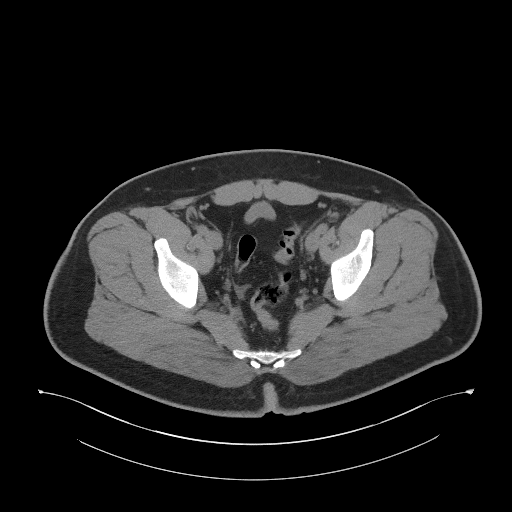
[im 38/112  soft-tissue]
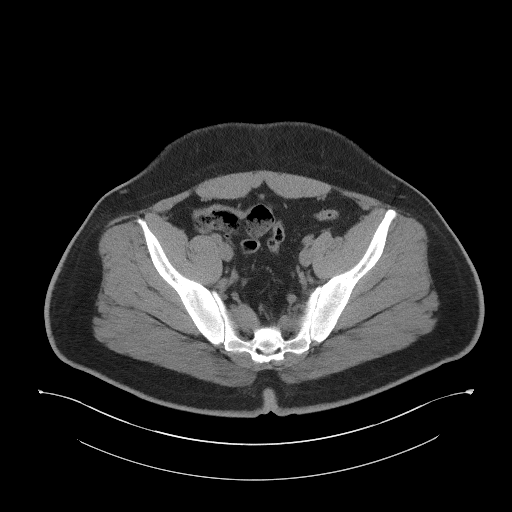
[im 47/112  soft-tissue]
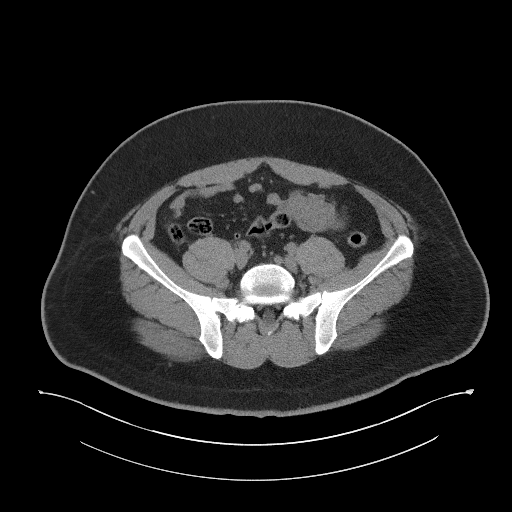
[im 56/112  soft-tissue]
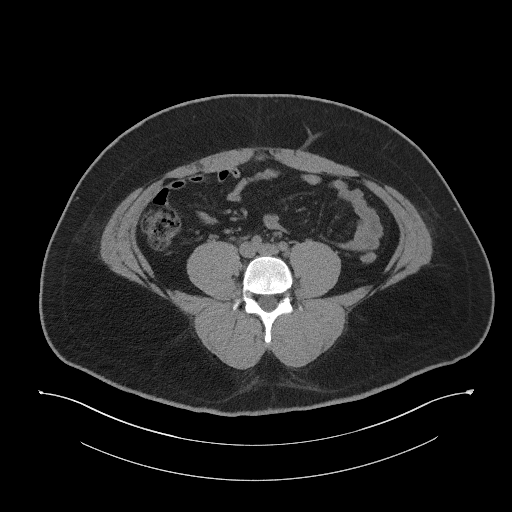
[im 65/112  soft-tissue]
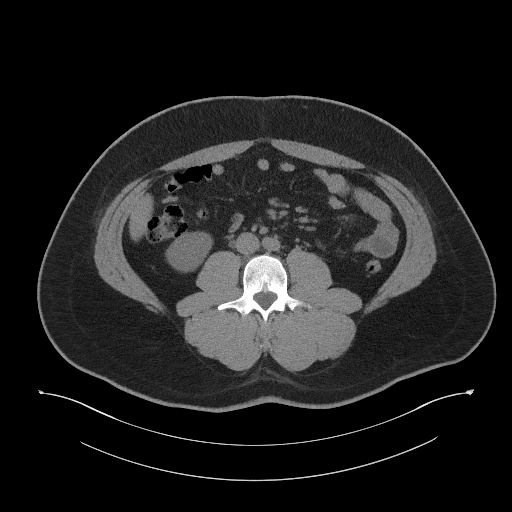
[im 75/112  soft-tissue]
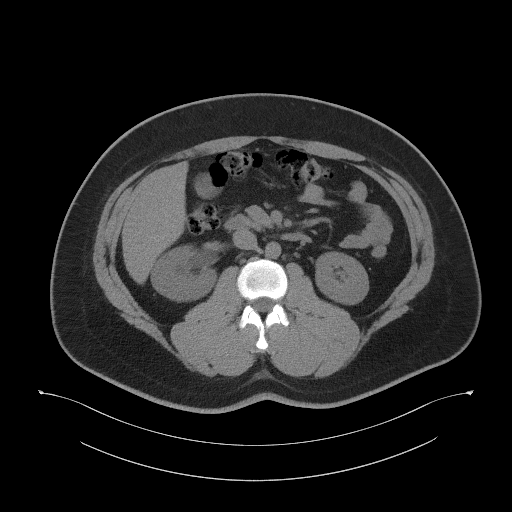
[im 75/112  bone]
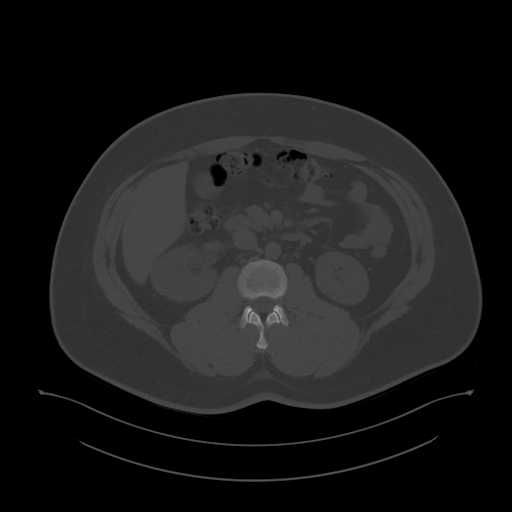
[im 79/112  soft-tissue]
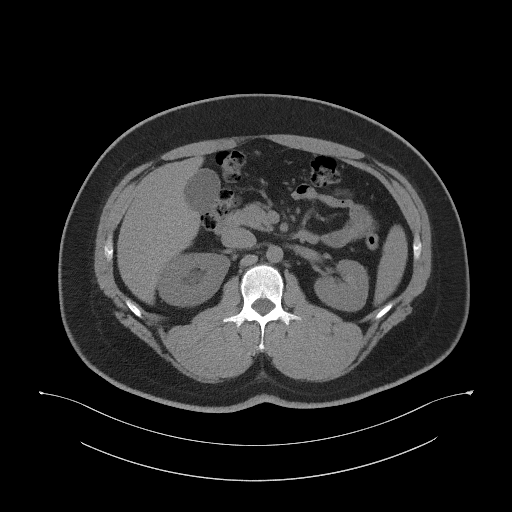
[im 88/112  soft-tissue]
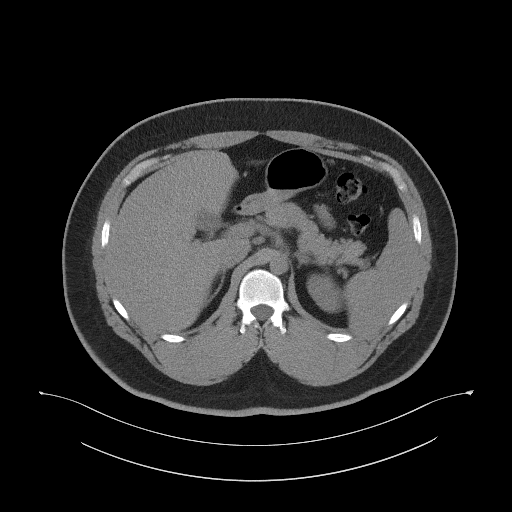
[im 98/112  soft-tissue]
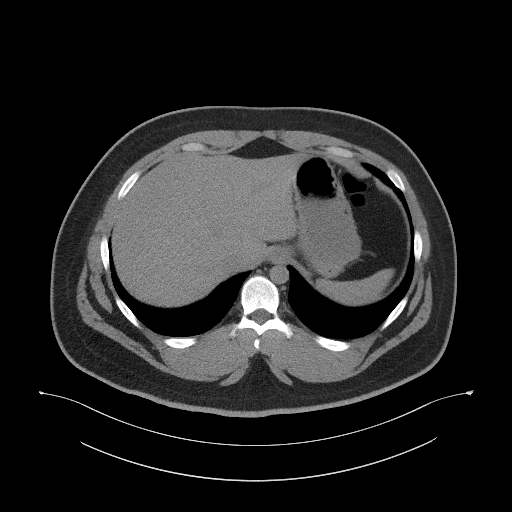
[im 107/112  soft-tissue]
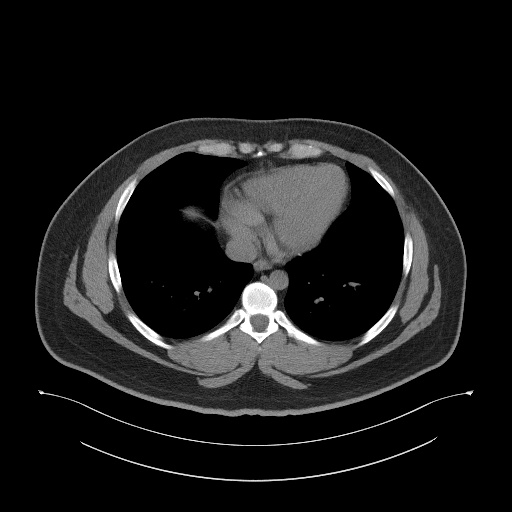

[Series 5: coronal st · coronal · 1.01mm/px · 3 of 126 slices shown]
[im 42/126  soft-tissue]
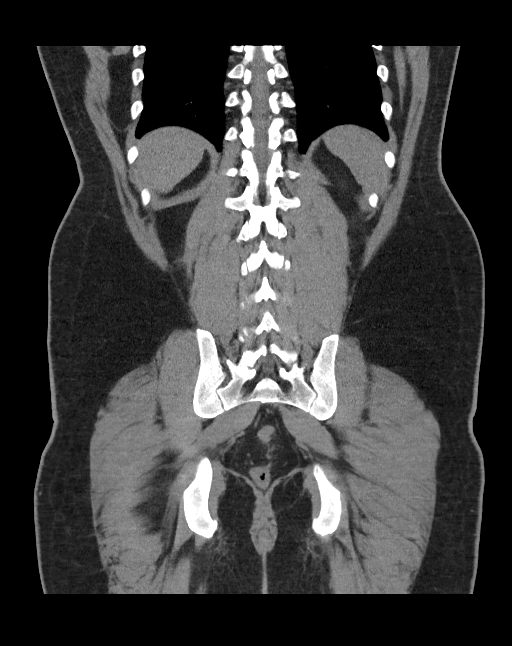
[im 56/126  soft-tissue]
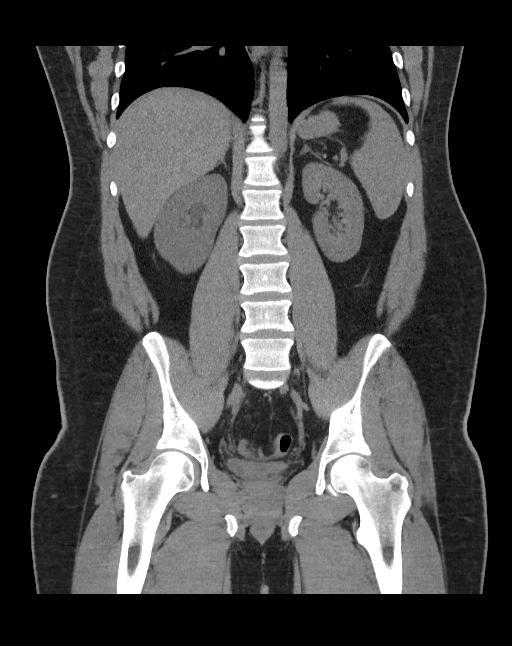
[im 70/126  soft-tissue]
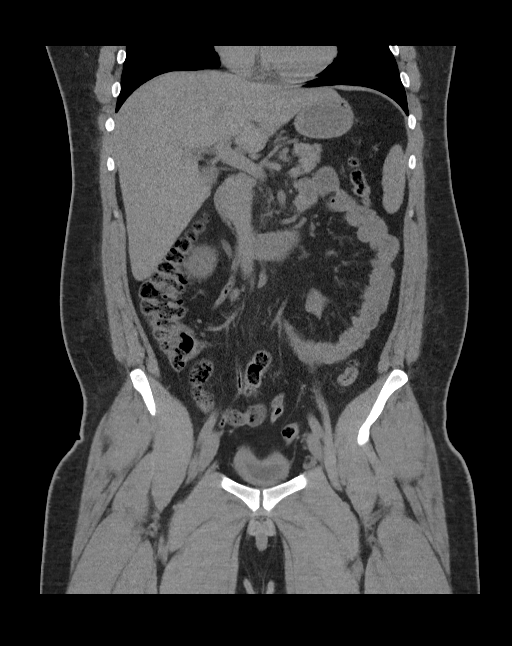

[16 of 46 positions shown; findings below may reference images not displayed]

FINDINGS: Lower chest: No acute abnormality.

Hepatobiliary: No focal liver abnormality is seen. No gallstones,
gallbladder wall thickening, or biliary dilatation.

Pancreas: Unremarkable. No pancreatic ductal dilatation or
surrounding inflammatory changes.

Spleen: Normal in size without focal abnormality.

Adrenals/Urinary Tract: There is a 3 mm calculus in the mid right
ureter. There is mild right-sided hydronephrosis. No additional
urinary tract calculi are seen. The left kidney, adrenal glands and
bladder are within normal limits.

Stomach/Bowel: Stomach is within normal limits. Appendix appears
normal. No evidence of bowel wall thickening, distention, or
inflammatory changes.

Vascular/Lymphatic: No significant vascular findings are present. No
enlarged abdominal or pelvic lymph nodes. There are nonenlarged
central mesenteric lymph nodes.

Reproductive: Prostate is unremarkable.

Other: There is trace free fluid in the pelvis. No focal abdominal
wall hernia.

Musculoskeletal: No acute or significant osseous findings.
IMPRESSION: 1. 3 mm calculus in the mid right ureter with mild obstructive
uropathy.
2. Trace amount of free fluid in the pelvis.
